# Patient Record
Sex: Female | Born: 1975 | Race: White | Hispanic: No | Marital: Married | Smoking: Never smoker
Health system: Southern US, Community
[De-identification: ages and names within clinical notes are randomized; demographics above are authoritative.]

## PROBLEM LIST (undated history)

## (undated) DIAGNOSIS — K5792 Diverticulitis of intestine, part unspecified, without perforation or abscess without bleeding: Secondary | ICD-10-CM

## (undated) DIAGNOSIS — K219 Gastro-esophageal reflux disease without esophagitis: Secondary | ICD-10-CM

## (undated) DIAGNOSIS — K5732 Diverticulitis of large intestine without perforation or abscess without bleeding: Secondary | ICD-10-CM

## (undated) DIAGNOSIS — I10 Essential (primary) hypertension: Secondary | ICD-10-CM

## (undated) DIAGNOSIS — J189 Pneumonia, unspecified organism: Secondary | ICD-10-CM

## (undated) DIAGNOSIS — K651 Peritoneal abscess: Secondary | ICD-10-CM

## (undated) DIAGNOSIS — E119 Type 2 diabetes mellitus without complications: Secondary | ICD-10-CM

## (undated) DIAGNOSIS — G4730 Sleep apnea, unspecified: Secondary | ICD-10-CM

## (undated) HISTORY — DX: Diverticulitis of large intestine without perforation or abscess without bleeding: K57.32

## (undated) HISTORY — PX: OTHER SURGICAL HISTORY: SHX169

## (undated) HISTORY — DX: Peritoneal abscess: K65.1

## (undated) HISTORY — DX: Diverticulitis of intestine, part unspecified, without perforation or abscess without bleeding: K57.92

## (undated) HISTORY — DX: Gastro-esophageal reflux disease without esophagitis: K21.9

## (undated) HISTORY — DX: Essential (primary) hypertension: I10

---

## 1999-09-12 HISTORY — PX: CHOLECYSTECTOMY: SHX55

## 2005-08-22 ENCOUNTER — Ambulatory Visit: Payer: Self-pay | Admitting: Obstetrics and Gynecology

## 2005-11-22 ENCOUNTER — Observation Stay: Payer: Self-pay

## 2005-12-04 ENCOUNTER — Inpatient Hospital Stay: Payer: Self-pay | Admitting: Obstetrics and Gynecology

## 2009-09-09 ENCOUNTER — Ambulatory Visit: Payer: Self-pay | Admitting: Obstetrics and Gynecology

## 2009-09-13 ENCOUNTER — Inpatient Hospital Stay: Payer: Self-pay | Admitting: Obstetrics and Gynecology

## 2010-05-31 ENCOUNTER — Ambulatory Visit: Payer: Self-pay | Admitting: Family Medicine

## 2010-10-11 NOTE — Assessment & Plan Note (Signed)
Summary: FLU SHOT/EVM  History of Present Illness Reason for visit: see chief complaint Chief Complaint: flu shot   Assessment New Problems: NEED PROPHYLACTIC VACCINATION&INOCULATION FLU (ICD-V04.81)   The patient and/or caregiver has been counseled thoroughly with regard to medications prescribed including dosage, schedule, interactions, rationale for use, and possible side effects and they verbalize understanding.  Diagnoses and expected course of recovery discussed and will return if not improved as expected or if the condition worsens. Patient and/or caregiver verbalized understanding.   Orders Added: 1)  Flu Vaccine 64yrs + [90658] 2)  Admin 1st Vaccine [90471]   Immunizations Administered:  Influenza Vaccine:    Vaccine Type: FLULAVAL    Site: left deltoid    Mfr: Gl[employer]oSmith[employer]    Dose: 0.5 ml    Route: IM    Given by: Levonne Spiller EMT-P    Exp. Date: 02/09/2011    Lot #: [id number]    VIS given: 04/05/10 version given May 31, 2010.  Flu Vaccine Consent Questions:    Do you have a history of severe allergic reactions to this vaccine? no    Any prior history of allergic reactions to egg and/or gelatin? no    Do you have a sensitivity to the preservative Thimersol? no    Do you have a past history of Guillan-Barre Syndrome? no    Do you currently have an acute febrile illness? no    Have you ever had a severe reaction to latex? no    Vaccine information given and explained to patient? yes    Are you currently pregnant? no

## 2013-09-19 ENCOUNTER — Ambulatory Visit (INDEPENDENT_AMBULATORY_CARE_PROVIDER_SITE_OTHER): Payer: BC Managed Care – PPO | Admitting: Podiatry

## 2013-09-19 ENCOUNTER — Ambulatory Visit (INDEPENDENT_AMBULATORY_CARE_PROVIDER_SITE_OTHER): Payer: BC Managed Care – PPO

## 2013-09-19 ENCOUNTER — Encounter: Payer: Self-pay | Admitting: Podiatry

## 2013-09-19 VITALS — BP 119/76 | HR 89 | Resp 16 | Ht 62.0 in | Wt 198.0 lb

## 2013-09-19 DIAGNOSIS — M79609 Pain in unspecified limb: Secondary | ICD-10-CM

## 2013-09-19 DIAGNOSIS — M79672 Pain in left foot: Secondary | ICD-10-CM

## 2013-09-19 DIAGNOSIS — M7750 Other enthesopathy of unspecified foot: Secondary | ICD-10-CM

## 2013-09-19 MED ORDER — DICLOFENAC SODIUM 75 MG PO TBEC: 75.0000 mg | DELAYED_RELEASE_TABLET | Freq: Two times a day (BID) | ORAL | Status: DC

## 2013-09-19 MED ORDER — TRIAMCINOLONE ACETONIDE 10 MG/ML IJ SUSP
10.0000 mg | Freq: Once | INTRAMUSCULAR | Status: AC
  Administered 2013-09-19: 10 mg

## 2013-09-19 NOTE — Progress Notes (Signed)
Subjective:     Patient ID: Maria Mcbride, female   DOB: 23-Dec-1975, 38 y.o.   MRN: [id number]  Foot Pain   patient points to the top and lateral side of the left foot stating it has been hurting her for about 8 weeks and makes it difficult for her to work her 12 hour shifts at work. 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       Objective:   Physical Exam  Nursing note and vitals reviewed. Constitutional: Maria Mcbride is oriented to person, place, and time.  Cardiovascular: Intact distal pulses.   Musculoskeletal: Normal range of motion.  Neurological: Maria Mcbride is oriented to person, place, and time.  Skin: Skin is warm.   neurovascular status intact with normal muscle strength and no equinus condition noted of both feet. Patient is found to have tendinitis of the dorsal lateral left foot with a relatively wide distribution pattern occurring below the sinus tarsi in distal to this point very painful when pressed    Assessment:     Tendinitis left lateral foot secondary to probable overusage over the holiday    Plan:     H&P and x-rays reviewed. Careful injection of the left dorsal lateral foot 3 mg Kenalog 5 mg Xylocaine Marcaine mixture to reduce inflammation and placed on voltaren  75 mg twice a day point to recheck in one

## 2013-09-19 NOTE — Progress Notes (Signed)
   Subjective:    Patient ID: Maria Mcbride, female    DOB: 1976-06-09, 38 y.o.   MRN: [id number]  HPI Comments: Pain on the top of the left foot, it started back before thanksgiving , it is a sharp pain , it aches when standing on it , worse at the end of the day , i am on my feet for 12 hours a day , taken ibuprofen and heat to it , rest   Foot Pain      Review of Systems  All other systems reviewed and are negative.       Objective:   Physical Exam        Assessment & Plan:

## 2013-09-25 ENCOUNTER — Ambulatory Visit: Payer: Self-pay | Admitting: Podiatry

## 2013-09-26 ENCOUNTER — Ambulatory Visit: Payer: BC Managed Care – PPO | Admitting: Podiatry

## 2013-10-03 ENCOUNTER — Encounter: Payer: Self-pay | Admitting: Podiatry

## 2013-10-03 ENCOUNTER — Ambulatory Visit (INDEPENDENT_AMBULATORY_CARE_PROVIDER_SITE_OTHER): Payer: BC Managed Care – PPO | Admitting: Podiatry

## 2013-10-03 VITALS — BP 124/78 | HR 104 | Resp 16 | Ht 62.0 in | Wt 195.0 lb

## 2013-10-03 DIAGNOSIS — M779 Enthesopathy, unspecified: Secondary | ICD-10-CM

## 2013-10-03 DIAGNOSIS — M7750 Other enthesopathy of unspecified foot: Secondary | ICD-10-CM

## 2013-10-03 MED ORDER — TRIAMCINOLONE ACETONIDE 10 MG/ML IJ SUSP
10.0000 mg | Freq: Once | INTRAMUSCULAR | Status: AC
  Administered 2013-10-03: 10 mg

## 2013-10-03 NOTE — Progress Notes (Signed)
Subjective:     Patient ID: Maria SchwabKristie Thurow, female   DOB: 09/27/1975, 38 y.o.   MRN: [id number]  HPI patient presents stating that the left foot is not quite sore but still quite bothersome it very difficult for me to work as bad when the pain is worse   Review of Systems     Objective:   Physical Exam Neurovascular status intact with no health history changes noted and pain that is not as much in the dorsal lateral area as it now is in the sinus tarsi left ankle    Assessment:     Tendinitis with inflammation of the left sinus tarsi with standing on feet been a complicating factor    Plan:     Sinus tarsi injection administered today and recommendation for him mobilization with air fracture walker which was dispensed a patient with instructions. Scanned for custom orthotics to reduce stress on feet and continue with old tear in and reappoint when orthotics are ready

## 2013-10-10 ENCOUNTER — Ambulatory Visit: Payer: BC Managed Care – PPO | Admitting: Podiatry

## 2013-10-21 ENCOUNTER — Encounter: Payer: Self-pay | Admitting: *Deleted

## 2013-10-21 NOTE — Progress Notes (Signed)
SENT PT POSTCARD LETTING HER KNOW ORTHOTICS ARE IN. 

## 2013-11-21 ENCOUNTER — Ambulatory Visit (INDEPENDENT_AMBULATORY_CARE_PROVIDER_SITE_OTHER): Payer: BC Managed Care – PPO | Admitting: Podiatry

## 2013-11-21 VITALS — BP 133/82 | HR 94 | Resp 16 | Ht 62.0 in | Wt 185.0 lb

## 2013-11-21 DIAGNOSIS — M779 Enthesopathy, unspecified: Secondary | ICD-10-CM

## 2013-11-21 MED ORDER — TRIAMCINOLONE ACETONIDE 10 MG/ML IJ SUSP
10.0000 mg | Freq: Once | INTRAMUSCULAR | Status: AC
  Administered 2013-11-21: 10 mg

## 2013-11-21 NOTE — Patient Instructions (Signed)

## 2013-11-21 NOTE — Progress Notes (Signed)
Subjective:     Patient ID: Maria Mcbride, female   DOB: Aug 23, 1976, 38 y.o.   MRN: [id number]  HPI patient presents to pick up her orthotics stating the ankle joint is still sore. States it's improved but she can still feel deep discomfort within the joint   Review of Systems     Objective:   Physical Exam Neurovascular status intact with no other health history changes noted with pain in the sinus tarsi left and inflammation    Assessment:     Sinus tarsitis left with inflammation and pain    Plan:     Careful injection of the sinus tarsi 3 mg Kenalog 5 of Xylocaine Marcaine mixture and dispensed orthotics with instructions on usage. Continue oral medication

## 2013-12-26 ENCOUNTER — Ambulatory Visit: Payer: BC Managed Care – PPO | Admitting: Podiatry

## 2014-01-02 ENCOUNTER — Ambulatory Visit: Payer: BC Managed Care – PPO | Admitting: Podiatry

## 2014-08-06 ENCOUNTER — Emergency Department: Payer: Self-pay | Admitting: Emergency Medicine

## 2014-12-17 LAB — HM PAP SMEAR: HM PAP: NORMAL

## 2015-10-27 ENCOUNTER — Emergency Department: Payer: BLUE CROSS/BLUE SHIELD

## 2015-10-27 ENCOUNTER — Inpatient Hospital Stay
Admission: EM | Admit: 2015-10-27 | Discharge: 2015-10-31 | DRG: 392 | Disposition: A | Discharge status: Home / Self Care | Payer: BLUE CROSS/BLUE SHIELD | Attending: Surgery | Admitting: Surgery

## 2015-10-27 ENCOUNTER — Encounter: Payer: Self-pay | Admitting: *Deleted

## 2015-10-27 DIAGNOSIS — R109 Unspecified abdominal pain: Secondary | ICD-10-CM

## 2015-10-27 DIAGNOSIS — R10A0 Flank pain, unspecified side: Secondary | ICD-10-CM

## 2015-10-27 DIAGNOSIS — Z9049 Acquired absence of other specified parts of digestive tract: Secondary | ICD-10-CM

## 2015-10-27 DIAGNOSIS — K5792 Diverticulitis of intestine, part unspecified, without perforation or abscess without bleeding: Secondary | ICD-10-CM | POA: Diagnosis present

## 2015-10-27 DIAGNOSIS — Z800 Family history of malignant neoplasm of digestive organs: Secondary | ICD-10-CM | POA: Diagnosis not present

## 2015-10-27 DIAGNOSIS — K5720 Diverticulitis of large intestine with perforation and abscess without bleeding: Secondary | ICD-10-CM | POA: Diagnosis not present

## 2015-10-27 DIAGNOSIS — E119 Type 2 diabetes mellitus without complications: Secondary | ICD-10-CM | POA: Diagnosis present

## 2015-10-27 DIAGNOSIS — R0902 Hypoxemia: Secondary | ICD-10-CM

## 2015-10-27 DIAGNOSIS — E669 Obesity, unspecified: Secondary | ICD-10-CM | POA: Diagnosis present

## 2015-10-27 DIAGNOSIS — I10 Essential (primary) hypertension: Secondary | ICD-10-CM | POA: Diagnosis present

## 2015-10-27 LAB — COMPREHENSIVE METABOLIC PANEL
ALK PHOS: 49 U/L (ref 38–126)
ALT: 25 U/L (ref 14–54)
ANION GAP: 10 (ref 5–15)
AST: 24 U/L (ref 15–41)
Albumin: 4.1 g/dL (ref 3.5–5.0)
BUN: 12 mg/dL (ref 6–20)
CALCIUM: 8.6 mg/dL — AB (ref 8.9–10.3)
CO2: 22 mmol/L (ref 22–32)
CREATININE: 0.64 mg/dL (ref 0.44–1.00)
Chloride: 104 mmol/L (ref 101–111)
Glucose, Bld: 119 mg/dL — ABNORMAL HIGH (ref 65–99)
Potassium: 3.8 mmol/L (ref 3.5–5.1)
SODIUM: 136 mmol/L (ref 135–145)
Total Bilirubin: 1.3 mg/dL — ABNORMAL HIGH (ref 0.3–1.2)
Total Protein: 7.2 g/dL (ref 6.5–8.1)

## 2015-10-27 LAB — CBC
HCT: 36.4 % (ref 35.0–47.0)
HEMOGLOBIN: 12.1 g/dL (ref 12.0–16.0)
MCH: 28.5 pg (ref 26.0–34.0)
MCHC: 33.2 g/dL (ref 32.0–36.0)
MCV: 85.7 fL (ref 80.0–100.0)
PLATELETS: 220 10*3/uL (ref 150–440)
RBC: 4.25 MIL/uL (ref 3.80–5.20)
RDW: 15.8 % — ABNORMAL HIGH (ref 11.5–14.5)
WBC: 16.4 10*3/uL — AB (ref 3.6–11.0)

## 2015-10-27 LAB — URINALYSIS COMPLETE WITH MICROSCOPIC (ARMC ONLY)
BILIRUBIN URINE: NEGATIVE
Glucose, UA: NEGATIVE mg/dL
HGB URINE DIPSTICK: NEGATIVE
KETONES UR: NEGATIVE mg/dL
LEUKOCYTES UA: NEGATIVE
NITRITE: NEGATIVE
PH: 9 — AB (ref 5.0–8.0)
PROTEIN: 30 mg/dL — AB
SPECIFIC GRAVITY, URINE: 1.027 (ref 1.005–1.030)

## 2015-10-27 LAB — PREGNANCY, URINE: Preg Test, Ur: NEGATIVE

## 2015-10-27 LAB — LIPASE, BLOOD: LIPASE: 21 U/L (ref 11–51)

## 2015-10-27 MED ORDER — PIPERACILLIN-TAZOBACTAM 3.375 G IVPB
3.3750 g | Freq: Once | INTRAVENOUS | Status: AC
  Administered 2015-10-27: 3.375 g via INTRAVENOUS

## 2015-10-27 MED ORDER — HEPARIN SODIUM (PORCINE) 5000 UNIT/ML IJ SOLN
5000.0000 [IU] | Freq: Three times a day (TID) | INTRAMUSCULAR | Status: DC
  Administered 2015-10-27 – 2015-10-31 (×11): 5000 [IU] via SUBCUTANEOUS
  Filled 2015-10-27 (×11): qty 1

## 2015-10-27 MED ORDER — MORPHINE SULFATE (PF) 4 MG/ML IV SOLN
INTRAVENOUS | Status: AC
  Administered 2015-10-27: 6 mg via INTRAVENOUS
  Filled 2015-10-27: qty 1

## 2015-10-27 MED ORDER — LACTATED RINGERS IV SOLN
INTRAVENOUS | Status: DC
  Administered 2015-10-27 – 2015-10-31 (×10): via INTRAVENOUS

## 2015-10-27 MED ORDER — HYDROCODONE-ACETAMINOPHEN 5-325 MG PO TABS
1.0000 | ORAL_TABLET | ORAL | Status: DC | PRN
  Administered 2015-10-27: 1 via ORAL
  Administered 2015-10-28 – 2015-10-29 (×3): 2 via ORAL
  Administered 2015-10-29: 1 via ORAL
  Administered 2015-10-29 – 2015-10-30 (×2): 2 via ORAL
  Filled 2015-10-27: qty 2
  Filled 2015-10-27: qty 1
  Filled 2015-10-27 (×5): qty 2

## 2015-10-27 MED ORDER — MORPHINE SULFATE (PF) 2 MG/ML IV SOLN
INTRAVENOUS | Status: AC
  Administered 2015-10-27: 2 mg via INTRAVENOUS
  Filled 2015-10-27: qty 1

## 2015-10-27 MED ORDER — PIPERACILLIN-TAZOBACTAM 3.375 G IVPB
INTRAVENOUS | Status: AC
  Administered 2015-10-27: 3.375 g via INTRAVENOUS
  Filled 2015-10-27: qty 50

## 2015-10-27 MED ORDER — ONDANSETRON HCL 4 MG/2ML IJ SOLN
4.0000 mg | Freq: Four times a day (QID) | INTRAMUSCULAR | Status: DC | PRN
  Administered 2015-10-27 – 2015-10-30 (×6): 4 mg via INTRAVENOUS
  Filled 2015-10-27 (×6): qty 2

## 2015-10-27 MED ORDER — ONDANSETRON HCL 4 MG/2ML IJ SOLN
4.0000 mg | Freq: Once | INTRAMUSCULAR | Status: AC
  Administered 2015-10-27: 4 mg via INTRAVENOUS
  Filled 2015-10-27: qty 2

## 2015-10-27 MED ORDER — MORPHINE SULFATE (PF) 2 MG/ML IV SOLN
2.0000 mg | INTRAVENOUS | Status: DC | PRN
  Administered 2015-10-27 – 2015-10-28 (×3): 2 mg via INTRAVENOUS
  Filled 2015-10-27 (×3): qty 1

## 2015-10-27 MED ORDER — DICLOFENAC SODIUM 25 MG PO TBEC
75.0000 mg | DELAYED_RELEASE_TABLET | Freq: Two times a day (BID) | ORAL | Status: DC
  Administered 2015-10-27 – 2015-10-31 (×8): 75 mg via ORAL
  Filled 2015-10-27: qty 1
  Filled 2015-10-27 (×3): qty 3
  Filled 2015-10-27 (×5): qty 1
  Filled 2015-10-27: qty 3
  Filled 2015-10-27: qty 1
  Filled 2015-10-27 (×3): qty 3
  Filled 2015-10-27: qty 1

## 2015-10-27 MED ORDER — PANTOPRAZOLE SODIUM 40 MG IV SOLR
40.0000 mg | Freq: Two times a day (BID) | INTRAVENOUS | Status: DC
  Administered 2015-10-27 – 2015-10-31 (×8): 40 mg via INTRAVENOUS
  Filled 2015-10-27 (×8): qty 40

## 2015-10-27 MED ORDER — PIPERACILLIN-TAZOBACTAM 3.375 G IVPB 30 MIN: 3.3750 g | Freq: Three times a day (TID) | INTRAVENOUS | Status: DC

## 2015-10-27 MED ORDER — METFORMIN HCL ER 500 MG PO TB24
500.0000 mg | ORAL_TABLET | Freq: Every day | ORAL | Status: DC
  Administered 2015-10-28 – 2015-10-31 (×4): 500 mg via ORAL
  Filled 2015-10-27 (×5): qty 1

## 2015-10-27 MED ORDER — PIPERACILLIN-TAZOBACTAM 3.375 G IVPB
3.3750 g | Freq: Three times a day (TID) | INTRAVENOUS | Status: DC
  Administered 2015-10-27 – 2015-10-31 (×11): 3.375 g via INTRAVENOUS
  Filled 2015-10-27 (×14): qty 50

## 2015-10-27 MED ORDER — SODIUM CHLORIDE 0.9 % IV BOLUS (SEPSIS)
1000.0000 mL | Freq: Once | INTRAVENOUS | Status: AC
  Administered 2015-10-27: 1000 mL via INTRAVENOUS

## 2015-10-27 MED ORDER — ONDANSETRON HCL 4 MG PO TABS
4.0000 mg | ORAL_TABLET | Freq: Four times a day (QID) | ORAL | Status: DC | PRN
  Administered 2015-10-29 – 2015-10-31 (×2): 4 mg via ORAL
  Filled 2015-10-27 (×3): qty 1

## 2015-10-27 MED ORDER — LISINOPRIL 10 MG PO TABS
10.0000 mg | ORAL_TABLET | Freq: Every day | ORAL | Status: DC
  Filled 2015-10-27: qty 1

## 2015-10-27 MED ORDER — MORPHINE SULFATE (PF) 4 MG/ML IV SOLN
6.0000 mg | Freq: Once | INTRAVENOUS | Status: AC
  Administered 2015-10-27: 6 mg via INTRAVENOUS

## 2015-10-27 NOTE — ED Notes (Signed)
Pt complains of lower abdominal pain starting yesterday with nausea, pt denies any other symptoms

## 2015-10-27 NOTE — H&P (Signed)
Maria Mcbride is an 40 y.o. female.    Chief Complaint: Left lower quadrant pain  HPI: This a patient with acute onset a left lower quadrant pain last night after eating and visiting with friends. She's had some fevers but had not taken her temperature. She's  never had an episode like this before. She denies nausea or vomiting had a normal bowel movement this morning melena or hematochezia  There is a family history of colon cancer  She is diabetic and has hypertension  He has had 2 C-sections and a cholecystectomy  Past Medical History  Diagnosis Date  . Hypertension     History reviewed. No pertinent past surgical history.  No family history on file. Social History:  reports that she has never smoked. She has never used smokeless tobacco. She reports that she does not drink alcohol or use illicit drugs.  Allergies: No Known Allergies   (Not in a hospital admission)   Review of Systems  Constitutional: Negative for fever, chills and weight loss.  HENT: Negative.   Eyes: Negative.   Respiratory: Negative.   Cardiovascular: Negative.   Gastrointestinal: Positive for abdominal pain. Negative for heartburn, nausea, vomiting, diarrhea, constipation, blood in stool and melena.  Genitourinary: Negative.   Musculoskeletal: Negative.   Skin: Negative.   Neurological: Negative.   Endo/Heme/Allergies: Negative.   Psychiatric/Behavioral: Negative.      Physical Exam:  BP 125/79 mmHg  Pulse 123  Temp(Src) 99.2 F (37.3 C) (Oral)  Resp 20  Ht _0  (1.575 m)  Wt 180 lb (81.647 kg)  BMI 32.91 kg/m2  SpO2 96%  LMP 10/27/2015  Physical Exam  Constitutional: She is oriented to person, place, and time and well-developed, well-nourished, and in no distress. No distress.  Not distressed but uncomfortable-appearing  HENT:  Head: Normocephalic and atraumatic.  Eyes: Pupils are equal, round, and reactive to light. Right eye exhibits no discharge. Left eye exhibits no  discharge. No scleral icterus.  Neck: Normal range of motion.  Cardiovascular: Normal rate, regular rhythm and normal heart sounds.   Pulmonary/Chest: Effort normal and breath sounds normal. No respiratory distress. She has no wheezes. She has no rales.  Abdominal: Soft. She exhibits no distension. There is tenderness. There is no rebound and no guarding.  Tender in the left lower quadrant maximal with questionable guarding but no rebound or percussion tenderness  Musculoskeletal: Normal range of motion. She exhibits no edema or tenderness.  Lymphadenopathy:    She has no cervical adenopathy.  Neurological: She is alert and oriented to person, place, and time.  Skin: Skin is warm and dry. She is not diaphoretic. No erythema.  Psychiatric: Mood and affect normal.  Vitals reviewed.       Results for orders placed or performed during the hospital encounter of 10/27/15 (from the past 48 hour(s))  Lipase, blood     Status: None   Collection Time: 10/27/15  2:19 PM  Result Value Ref Range   Lipase 21 11 - 51 U/L  Comprehensive metabolic panel     Status: Abnormal   Collection Time: 10/27/15  2:19 PM  Result Value Ref Range   Sodium 136 135 - 145 mmol/L   Potassium 3.8 3.5 - 5.1 mmol/L   Chloride 104 101 - 111 mmol/L   CO2 22 22 - 32 mmol/L   Glucose, Bld 119 (H) 65 - 99 mg/dL   BUN 12 6 - 20 mg/dL   Creatinine, Ser 0.64 0.44 - 1.00 mg/dL  Calcium 8.6 (L) 8.9 - 10.3 mg/dL   Total Protein 7.2 6.5 - 8.1 g/dL   Albumin 4.1 3.5 - 5.0 g/dL   AST 24 15 - 41 U/L   ALT 25 14 - 54 U/L   Alkaline Phosphatase 49 38 - 126 U/L   Total Bilirubin 1.3 (H) 0.3 - 1.2 mg/dL   GFR calc non Af Amer >60 >60 mL/min   GFR calc Af Amer >60 >60 mL/min    Comment: (NOTE) The eGFR has been calculated using the CKD EPI equation. This calculation has not been validated in all clinical situations. eGFR's persistently <60 mL/min signify possible Chronic Kidney Disease.    Anion gap 10 5 - 15  CBC      Status: Abnormal   Collection Time: 10/27/15  2:19 PM  Result Value Ref Range   WBC 16.4 (H) 3.6 - 11.0 K/uL   RBC 4.25 3.80 - 5.20 MIL/uL   Hemoglobin 12.1 12.0 - 16.0 g/dL   HCT 36.4 35.0 - 47.0 %   MCV 85.7 80.0 - 100.0 fL   MCH 28.5 26.0 - 34.0 pg   MCHC 33.2 32.0 - 36.0 g/dL   RDW 15.8 (H) 11.5 - 14.5 %   Platelets 220 150 - 440 K/uL  Urinalysis complete, with microscopic ([facility] only)     Status: Abnormal   Collection Time: 10/27/15  2:20 PM  Result Value Ref Range   Color, Urine YELLOW (A) YELLOW   APPearance CLEAR (A) CLEAR   Glucose, UA NEGATIVE NEGATIVE mg/dL   Bilirubin Urine NEGATIVE NEGATIVE   Ketones, ur NEGATIVE NEGATIVE mg/dL   Specific Gravity, Urine 1.027 1.005 - 1.030   Hgb urine dipstick NEGATIVE NEGATIVE   pH 9.0 (H) 5.0 - 8.0   Protein, ur 30 (A) NEGATIVE mg/dL   Nitrite NEGATIVE NEGATIVE   Leukocytes, UA NEGATIVE NEGATIVE   RBC / HPF 0-5 0 - 5 RBC/hpf   WBC, UA 0-5 0 - 5 WBC/hpf   Bacteria, UA RARE (A) NONE SEEN   Squamous Epithelial / LPF 0-5 (A) NONE SEEN   Mucous PRESENT   Pregnancy, urine     Status: None   Collection Time: 10/27/15  2:20 PM  Result Value Ref Range   Preg Test, Ur NEGATIVE NEGATIVE   Ct Renal Stone Study  10/27/2015  CLINICAL DATA:  Lower abdominal pain beginning yesterday with nausea. EXAM: CT ABDOMEN AND PELVIS WITHOUT CONTRAST TECHNIQUE: Multidetector CT imaging of the abdomen and pelvis was performed following the standard protocol without IV contrast. COMPARISON:  None. FINDINGS: Lung bases are normal. Abdominal images demonstrate mild amount of free peritoneal air scattered over the anterior upper abdomen. There is diverticulosis of the colon. There is mild inflammatory change adjacent a short segment of sigmoid colon with several extraluminal foci of air just inferior lateral to this inflamed segment likely the site of perforation secondary to acute diverticulitis. No evidence of diverticular abscess. No evidence of mass or  adjacent mesenteric adenopathy. Minimal fluid over the left pericolic gutter. Appendix is normal. Remainder of the colon small bowel are within normal. There is evidence of a previous cholecystectomy. The liver, spleen, pancreas and adrenal glands are normal. Stomach is within normal. Kidneys are normal in size, shape and position without hydronephrosis or nephrolithiasis. Ureters are normal. Vascular structures are within normal. Pelvic images demonstrate an IUD in adequate position. The uterus, ovaries, rectum and bladder are within normal. Remaining bones and soft tissues are within normal. IMPRESSION: Findings compatible with acute  diverticulitis of a short segment of the sigmoid colon with evidence of perforation with mild free peritoneal air. No evidence of diverticular abscess. No evidence of mass or adjacent adenopathy. Critical Value/emergent results were called by telephone at the time of interpretation on 10/27/2015 at 5:04 pm to Dr. Charlotte Crumb , who verbally acknowledged these results. Electronically Signed   By: Marin Olp M.D.   On: 10/27/2015 17:04     Assessment/Plan  CT scan and labs personally reviewed. This suggestion is that of microperforation was some minor free air suggesting perforated diverticulitis. I've discussed with she and her husband the rationale for admission to the hospital on IV antibiotics she has been given a dose of Zosyn. All questions were answered for them they understood and agreed to proceed with this plan  Florene Glen, MD, FACS

## 2015-10-27 NOTE — ED Provider Notes (Signed)
[facility] Emergency Department Provider Note  ____________________________________________   I have reviewed the triage vital signs and the nursing notes.   HISTORY  Chief Complaint Abdominal Pain    HPI Maria Mcbride is a 40 y.o. female  Who presents today complaining of abdominal pain. She states that she has not had a fever but she has felt somewhat warm. The pain began gradually last night. Left lower quadrant. She denies any fever or vomiting. She states the pain has been gradually worsening. She has had nausea. She denies any melena or bright red blood per rectum. She denies any vaginal discharge or vaginal bleeding. She states that she has had a left oophorectomy years ago.Nothing makes the pain better nothing makes it worse.  Past Medical History  Diagnosis Date  . Hypertension     There are no active problems to display for this patient.   History reviewed. No pertinent past surgical history.  Current Outpatient Rx  Name  Route  Sig  Dispense  Refill  . diclofenac (VOLTAREN) 75 MG EC tablet   Oral   Take 1 tablet (75 mg total) by mouth 2 (two) times daily.   50 tablet   1   . lisinopril (PRINIVIL,ZESTRIL) 10 MG tablet               . metFORMIN (GLUCOPHAGE-XR) 500 MG 24 hr tablet                 Allergies Review of patient's allergies indicates no known allergies.  No family history on file.  Social History Social History  Substance Use Topics  . Smoking status: Never Smoker   . Smokeless tobacco: Never Used  . Alcohol Use: No    Review of Systems Constitutional: No fever/chills Eyes: No visual changes. ENT: No sore throat. No stiff neck no neck pain Cardiovascular: Denies chest pain. Respiratory: Denies shortness of breath. Gastrointestinal:   no vomiting.  No diarrhea.  No constipation. Genitourinary: Negative for dysuria. Musculoskeletal: Negative lower extremity  swelling Skin: Negative for rash. Neurological: Negative for headaches, focal weakness or numbness. 10-point ROS otherwise negative.  ____________________________________________   PHYSICAL EXAM:  VITAL SIGNS: ED Triage Vitals  Enc Vitals Group     BP 10/27/15 1408 125/79 mmHg     Pulse Rate 10/27/15 1408 123     Resp 10/27/15 1408 20     Temp 10/27/15 1408 99.2 F (37.3 C)     Temp Source 10/27/15 1408 Oral     SpO2 10/27/15 1408 96 %     Weight 10/27/15 1408 180 lb (81.647 kg)     Height 10/27/15 1408  (1.575 m)     Head Cir --      Peak Flow --      Pain Score 10/27/15 1409 9     Pain Loc --      Pain Edu? --      Excl. in GC? --     Constitutional: Alert and oriented. Appears as if she does not feel well but nontoxic Eyes: Conjunctivae are normal. PERRL. EOMI. Head: Atraumatic. Nose: No congestion/rhinnorhea. Mouth/Throat: Mucous membranes are moist.  Oropharynx non-erythematous. Neck: No stridor.   Nontender with no meningismus Cardiovascular: Normal rate, regular rhythm. Grossly normal heart sounds.  Good peripheral circulation. Respiratory: Normal respiratory effort.  No retractions. Lungs CTAB. Abdominal: Positive  left lower quadrant tenderness, guarding but no rebound. Nonsurgical abdomen at this point No distention. Back:  There is no focal tenderness or step off there is no midline tenderness there are no lesions noted. there is left CVA tenderness Musculoskeletal: No lower extremity tenderness. No joint effusions, no DVT signs strong distal pulses no edema Neurologic:  Normal speech and language. No gross focal neurologic deficits are appreciated.  Skin:  Skin is warm, dry and intact. No rash noted. Psychiatric: Mood and affect are normal. Speech and behavior are normal.  ____________________________________________   LABS (all labs ordered are listed, but only abnormal results are displayed)  Labs Reviewed  COMPREHENSIVE METABOLIC PANEL -  Abnormal; Notable for the following:    Glucose, Bld 119 (*)    Calcium 8.6 (*)    Total Bilirubin 1.3 (*)    All other components within normal limits  CBC - Abnormal; Notable for the following:    WBC 16.4 (*)    RDW 15.8 (*)    All other components within normal limits  URINALYSIS COMPLETEWITH MICROSCOPIC ([facility] ONLY) - Abnormal; Notable for the following:    Color, Urine YELLOW (*)    APPearance CLEAR (*)    pH 9.0 (*)    Protein, ur 30 (*)    Bacteria, UA RARE (*)    Squamous Epithelial / LPF 0-5 (*)    All other components within normal limits  LIPASE, BLOOD  PREGNANCY, URINE  POC URINE PREG, ED   ____________________________________________  EKG  I personally interpreted any EKGs ordered by me or triage  ____________________________________________  RADIOLOGY  I reviewed any imaging ordered by me or triage that were performed during my shift ____________________________________________   PROCEDURES  Procedure(s) performed: None  Critical Care performed: None  ____________________________________________   INITIAL IMPRESSION / ASSESSMENT AND PLAN / ED COURSE  Pertinent labs & imaging results that were available during my care of the patient were reviewed by me and considered in my medical decision making (see chart for details).  Patient with perforated diverticulitis, Dr. Excell Seltzer of surgery is kind enough to come see the patient we will give her IV antibiotics she will require admission. ____________________________________________   FINAL CLINICAL IMPRESSION(S) / ED DIAGNOSES  Perforated diverticulitis    This chart was dictated using voice recognition software.  Despite best efforts to proofread,  errors can occur which can change meaning.     Jeanmarie Plant, MD 10/27/15 [phone]

## 2015-10-28 LAB — CBC
HEMATOCRIT: 32.2 % — AB (ref 35.0–47.0)
HEMOGLOBIN: 10.9 g/dL — AB (ref 12.0–16.0)
MCH: 29.9 pg (ref 26.0–34.0)
MCHC: 33.7 g/dL (ref 32.0–36.0)
MCV: 88.6 fL (ref 80.0–100.0)
Platelets: 173 10*3/uL (ref 150–440)
RBC: 3.64 MIL/uL — ABNORMAL LOW (ref 3.80–5.20)
RDW: 16.2 % — ABNORMAL HIGH (ref 11.5–14.5)
WBC: 14.4 10*3/uL — AB (ref 3.6–11.0)

## 2015-10-28 LAB — BASIC METABOLIC PANEL
ANION GAP: 7 (ref 5–15)
BUN: 9 mg/dL (ref 6–20)
CALCIUM: 7.9 mg/dL — AB (ref 8.9–10.3)
CO2: 26 mmol/L (ref 22–32)
Chloride: 103 mmol/L (ref 101–111)
Creatinine, Ser: 0.67 mg/dL (ref 0.44–1.00)
GLUCOSE: 115 mg/dL — AB (ref 65–99)
POTASSIUM: 3.1 mmol/L — AB (ref 3.5–5.1)
SODIUM: 136 mmol/L (ref 135–145)

## 2015-10-28 MED ORDER — DIPHENHYDRAMINE HCL 25 MG PO CAPS: 25.0000 mg | ORAL_CAPSULE | Freq: Four times a day (QID) | ORAL | Status: DC | PRN

## 2015-10-28 MED ORDER — IRBESARTAN 75 MG PO TABS
150.0000 mg | ORAL_TABLET | Freq: Every day | ORAL | Status: DC
  Filled 2015-10-28 (×2): qty 2

## 2015-10-28 MED ORDER — MORPHINE SULFATE (PF) 2 MG/ML IV SOLN
2.0000 mg | INTRAVENOUS | Status: DC | PRN
  Filled 2015-10-28: qty 1

## 2015-10-28 MED ORDER — MORPHINE SULFATE (PF) 2 MG/ML IV SOLN
2.0000 mg | INTRAVENOUS | Status: DC | PRN
  Administered 2015-10-28: 2 mg via INTRAVENOUS
  Administered 2015-10-28 – 2015-10-31 (×5): 4 mg via INTRAVENOUS
  Filled 2015-10-28: qty 2
  Filled 2015-10-28: qty 1
  Filled 2015-10-28 (×4): qty 2
  Filled 2015-10-28: qty 1

## 2015-10-28 NOTE — Progress Notes (Signed)
CC: Left lower quadrant abdominal pain Subjective: Is a patient admitted yesterday from the emergency room with a diagnosis of diverticulitis with microperforation. She states she's feeling much better today after only 18 hours of antibiotics. She's had no nausea or vomiting and is tolerating a clear liquid diet denies fevers or chills.  Objective: Vital signs in last 24 hours: Temp:  [98.7 F (37.1 C)-99.2 F (37.3 C)] 98.7 F (37.1 C) (02/15 2041) Pulse Rate:  [72-123] 107 (02/15 2041) Resp:  [14-20] 17 (02/15 2041) BP: (99-125)/(62-79) 110/78 mmHg (02/15 2041) SpO2:  [96 %-99 %] 98 % (02/15 2041) Weight:  [180 lb (81.647 kg)-204 lb 8 oz (92.761 kg)] 204 lb 8 oz (92.761 kg) (02/15 2041) Last BM Date: 10/27/15  Intake/Output from previous day: 02/15 0701 - 02/16 0700 In: 901 [I.V.:901] Out: -  Intake/Output this shift: Total I/O In: 830 [P.O.:240; I.V.:590] Out: -   Physical exam: Abdomen is soft and minimally tender in the left lower quadrant with an improved exam overall no peritoneal signs.  Calves are nontender  Lab Results: CBC   Recent Labs  10/27/15 1419 10/28/15 0507  WBC 16.4* 14.4*  HGB 12.1 10.9*  HCT 36.4 32.2*  PLT 220 173   BMET  Recent Labs  10/27/15 1419 10/28/15 0507  NA 136 136  K 3.8 3.1*  CL 104 103  CO2 22 26  GLUCOSE 119* 115*  BUN 12 9  CREATININE 0.64 0.67  CALCIUM 8.6* 7.9*   PT/INR No results for input(s): LABPROT, INR in the last 72 hours. ABG No results for input(s): PHART, HCO3 in the last 72 hours.  Invalid input(s): PCO2, PO2  Studies/Results: Ct Renal Stone Study  10/27/2015  CLINICAL DATA:  Lower abdominal pain beginning yesterday with nausea. EXAM: CT ABDOMEN AND PELVIS WITHOUT CONTRAST TECHNIQUE: Multidetector CT imaging of the abdomen and pelvis was performed following the standard protocol without IV contrast. COMPARISON:  None. FINDINGS: Lung bases are normal. Abdominal images demonstrate mild amount of free  peritoneal air scattered over the anterior upper abdomen. There is diverticulosis of the colon. There is mild inflammatory change adjacent a short segment of sigmoid colon with several extraluminal foci of air just inferior lateral to this inflamed segment likely the site of perforation secondary to acute diverticulitis. No evidence of diverticular abscess. No evidence of mass or adjacent mesenteric adenopathy. Minimal fluid over the left pericolic gutter. Appendix is normal. Remainder of the colon small bowel are within normal. There is evidence of a previous cholecystectomy. The liver, spleen, pancreas and adrenal glands are normal. Stomach is within normal. Kidneys are normal in size, shape and position without hydronephrosis or nephrolithiasis. Ureters are normal. Vascular structures are within normal. Pelvic images demonstrate an IUD in adequate position. The uterus, ovaries, rectum and bladder are within normal. Remaining bones and soft tissues are within normal. IMPRESSION: Findings compatible with acute diverticulitis of a short segment of the sigmoid colon with evidence of perforation with mild free peritoneal air. No evidence of diverticular abscess. No evidence of mass or adjacent adenopathy. Critical Value/emergent results were called by telephone at the time of interpretation on 10/27/2015 at 5:04 pm to Dr. Ileana Roup , who verbally acknowledged these results. Electronically Signed   By: Elberta Fortis M.D.   On: 10/27/2015 17:04    Anti-infectives: Anti-infectives    Start     Dose/Rate Route Frequency Ordered Stop   10/27/15 2200  piperacillin-tazobactam (ZOSYN) IVPB 3.375 g  Status:  Discontinued  3.375 g 100 mL/hr over 30 Minutes Intravenous 3 times per day 10/27/15 1802 10/27/15 2027   10/27/15 2200  piperacillin-tazobactam (ZOSYN) IVPB 3.375 g     3.375 g 12.5 mL/hr over 240 Minutes Intravenous 3 times per day 10/27/15 2027     10/27/15 1715  piperacillin-tazobactam (ZOSYN) IVPB  3.375 g     3.375 g 12.5 mL/hr over 240 Minutes Intravenous  Once 10/27/15 1705 10/27/15 1915      Assessment/Plan:  She's showing considerable improvement will continue IV antibiotics for today and consider discharge tomorrow if she continues to improve  Lattie Haw, MD, Carilyn Goodpasture  10/28/2015

## 2015-10-29 LAB — CBC WITH DIFFERENTIAL/PLATELET
BASOS ABS: 0 10*3/uL (ref 0–0.1)
Basophils Relative: 0 %
EOS ABS: 0 10*3/uL (ref 0–0.7)
EOS PCT: 0 %
HCT: 30.3 % — ABNORMAL LOW (ref 35.0–47.0)
HEMOGLOBIN: 10.1 g/dL — AB (ref 12.0–16.0)
LYMPHS PCT: 12 %
Lymphs Abs: 1.6 10*3/uL (ref 1.0–3.6)
MCH: 29.2 pg (ref 26.0–34.0)
MCHC: 33.3 g/dL (ref 32.0–36.0)
MCV: 87.7 fL (ref 80.0–100.0)
Monocytes Absolute: 0.6 10*3/uL (ref 0.2–0.9)
Monocytes Relative: 5 %
NEUTROS PCT: 83 %
Neutro Abs: 10.8 10*3/uL — ABNORMAL HIGH (ref 1.4–6.5)
PLATELETS: 163 10*3/uL (ref 150–440)
RBC: 3.45 MIL/uL — AB (ref 3.80–5.20)
RDW: 15.8 % — ABNORMAL HIGH (ref 11.5–14.5)
WBC: 13.1 10*3/uL — AB (ref 3.6–11.0)

## 2015-10-29 MED ORDER — PROMETHAZINE HCL 25 MG/ML IJ SOLN
12.5000 mg | Freq: Four times a day (QID) | INTRAMUSCULAR | Status: DC | PRN
  Administered 2015-10-29 – 2015-10-30 (×2): 12.5 mg via INTRAVENOUS
  Filled 2015-10-29 (×2): qty 1

## 2015-10-29 NOTE — Progress Notes (Signed)
CC: Q diverticulitis Subjective: This patient with acute diverticulitis and left lower quadrant pain and tenderness she's been admitted and started on IV antibiotics. She is improving albeit slowly her pain is a little bit worse than it was yesterday but overall much improved over previous statements. She's had no nausea or vomiting and is tolerating clear liquids she had a small bowel movement is having some pain when she urinates but the pain is in her rectum not in the urethra.  Objective: Vital signs in last 24 hours: Temp:  [97.4 F (36.3 C)-102.1 F (38.9 C)] 97.4 F (36.3 C) (02/17 0624) Pulse Rate:  [87-125] 87 (02/17 0624) Resp:  [16-18] 16 (02/17 0624) BP: (104-118)/(60-68) 104/63 mmHg (02/17 0624) SpO2:  [93 %-97 %] 93 % (02/17 0624) Last BM Date: 10/27/15  Intake/Output from previous day: 02/16 0701 - 02/17 0700 In: 4166.9 [P.O.:840; I.V.:3326.9] Out: 2500 [Urine:2500] Intake/Output this shift: Total I/O In: 262.1 [I.V.:247; IV Piggyback:15.1] Out: 0   Physical exam:  Obese I'll signs are reviewed Abdomen is soft tender in the left lower quadrant without peritoneal signs nontender calves no jaundice no icterus  Lab Results: CBC   Recent Labs  10/28/15 0507 10/29/15 0427  WBC 14.4* 13.1*  HGB 10.9* 10.1*  HCT 32.2* 30.3*  PLT 173 163   BMET  Recent Labs  10/27/15 1419 10/28/15 0507  NA 136 136  K 3.8 3.1*  CL 104 103  CO2 22 26  GLUCOSE 119* 115*  BUN 12 9  CREATININE 0.64 0.67  CALCIUM 8.6* 7.9*   PT/INR No results for input(s): LABPROT, INR in the last 72 hours. ABG No results for input(s): PHART, HCO3 in the last 72 hours.  Invalid input(s): PCO2, PO2  Studies/Results: Ct Renal Stone Study  10/27/2015  CLINICAL DATA:  Lower abdominal pain beginning yesterday with nausea. EXAM: CT ABDOMEN AND PELVIS WITHOUT CONTRAST TECHNIQUE: Multidetector CT imaging of the abdomen and pelvis was performed following the standard protocol without IV  contrast. COMPARISON:  None. FINDINGS: Lung bases are normal. Abdominal images demonstrate mild amount of free peritoneal air scattered over the anterior upper abdomen. There is diverticulosis of the colon. There is mild inflammatory change adjacent a short segment of sigmoid colon with several extraluminal foci of air just inferior lateral to this inflamed segment likely the site of perforation secondary to acute diverticulitis. No evidence of diverticular abscess. No evidence of mass or adjacent mesenteric adenopathy. Minimal fluid over the left pericolic gutter. Appendix is normal. Remainder of the colon small bowel are within normal. There is evidence of a previous cholecystectomy. The liver, spleen, pancreas and adrenal glands are normal. Stomach is within normal. Kidneys are normal in size, shape and position without hydronephrosis or nephrolithiasis. Ureters are normal. Vascular structures are within normal. Pelvic images demonstrate an IUD in adequate position. The uterus, ovaries, rectum and bladder are within normal. Remaining bones and soft tissues are within normal. IMPRESSION: Findings compatible with acute diverticulitis of a short segment of the sigmoid colon with evidence of perforation with mild free peritoneal air. No evidence of diverticular abscess. No evidence of mass or adjacent adenopathy. Critical Value/emergent results were called by telephone at the time of interpretation on 10/27/2015 at 5:04 pm to Dr. Ileana Roup , who verbally acknowledged these results. Electronically Signed   By: Elberta Fortis M.D.   On: 10/27/2015 17:04    Anti-infectives: Anti-infectives    Start     Dose/Rate Route Frequency Ordered Stop   10/27/15 2200  piperacillin-tazobactam (ZOSYN) IVPB 3.375 g  Status:  Discontinued     3.375 g 100 mL/hr over 30 Minutes Intravenous 3 times per day 10/27/15 1802 10/27/15 2027   10/27/15 2200  piperacillin-tazobactam (ZOSYN) IVPB 3.375 g     3.375 g 12.5 mL/hr over  240 Minutes Intravenous 3 times per day 10/27/15 2027     10/27/15 1715  piperacillin-tazobactam (ZOSYN) IVPB 3.375 g     3.375 g 12.5 mL/hr over 240 Minutes Intravenous  Once 10/27/15 1705 10/27/15 1915      Assessment/Plan:  White blood still remains elevated but not as high as on admission. Recommend continuing IV antibiotics and clear liquids for now  Lattie Haw, MD, FACS  10/29/2015

## 2015-10-29 NOTE — Care Management (Signed)
Tolerating clear liquids.Diet being advanced

## 2015-10-29 NOTE — Progress Notes (Signed)
Called Dr. Everlene Farrier regarding nausea medication per patient request.  Orders placed.  Arturo Morton  10/29/2015  7:44 PM

## 2015-10-30 ENCOUNTER — Inpatient Hospital Stay: Payer: BLUE CROSS/BLUE SHIELD

## 2015-10-30 DIAGNOSIS — R0902 Hypoxemia: Secondary | ICD-10-CM | POA: Insufficient documentation

## 2015-10-30 LAB — CBC WITH DIFFERENTIAL/PLATELET
BASOS PCT: 0 %
Basophils Absolute: 0 10*3/uL (ref 0–0.1)
EOS ABS: 0.1 10*3/uL (ref 0–0.7)
EOS PCT: 1 %
HCT: 29.3 % — ABNORMAL LOW (ref 35.0–47.0)
Hemoglobin: 9.6 g/dL — ABNORMAL LOW (ref 12.0–16.0)
Lymphocytes Relative: 8 %
Lymphs Abs: 1.1 10*3/uL (ref 1.0–3.6)
MCH: 29 pg (ref 26.0–34.0)
MCHC: 32.6 g/dL (ref 32.0–36.0)
MCV: 89.1 fL (ref 80.0–100.0)
MONO ABS: 0.5 10*3/uL (ref 0.2–0.9)
MONOS PCT: 4 %
Neutro Abs: 11.6 10*3/uL — ABNORMAL HIGH (ref 1.4–6.5)
Neutrophils Relative %: 87 %
Platelets: 166 10*3/uL (ref 150–440)
RBC: 3.29 MIL/uL — ABNORMAL LOW (ref 3.80–5.20)
RDW: 16.2 % — AB (ref 11.5–14.5)
WBC: 13.3 10*3/uL — ABNORMAL HIGH (ref 3.6–11.0)

## 2015-10-30 NOTE — Progress Notes (Signed)
CC: Acute diverticulitis Subjective: 's patient on IV antibiotics for acute diverticulitis she feels better today she has been placed on oxygen for a low oxygen saturation level when she was sleeping last night suggestive of sleep apnea. Patient agrees that she probably has sleep apnea as she has dropped problems sleeping at home and is obese. She has never been worked up for it. She is tolerating clears and overall feels better she is not short of breath and does not have any chest pain  Objective: Vital signs in last 24 hours: Temp:  [97.6 F (36.4 C)-97.8 F (36.6 C)] 97.6 F (36.4 C) (02/18 0446) Pulse Rate:  [102-122] 112 (02/18 0446) Resp:  [18-20] 20 (02/18 0446) BP: (103-108)/(58-72) 103/58 mmHg (02/18 0446) SpO2:  [78 %-97 %] 78 % (02/18 0446) Last BM Date: 10/27/15  Intake/Output from previous day: 02/17 0701 - 02/18 0700 In: 2170.4 [P.O.:840; I.V.:1243.1; IV Piggyback:87.3] Out: 900 [Urine:550; Stool:350] Intake/Output this shift:    Physical exam:  Awake alert oriented Chest clear no rales or rhonchi Abdomen is soft less tender no peritoneal signs maximal tenderness is in the left lower quadrant but much improved Abs are nontender  Lab Results: CBC   Recent Labs  10/29/15 0427 10/30/15 0520  WBC 13.1* 13.3*  HGB 10.1* 9.6*  HCT 30.3* 29.3*  PLT 163 166   BMET  Recent Labs  10/27/15 1419 10/28/15 0507  NA 136 136  K 3.8 3.1*  CL 104 103  CO2 22 26  GLUCOSE 119* 115*  BUN 12 9  CREATININE 0.64 0.67  CALCIUM 8.6* 7.9*   PT/INR No results for input(s): LABPROT, INR in the last 72 hours. ABG No results for input(s): PHART, HCO3 in the last 72 hours.  Invalid input(s): PCO2, PO2  Studies/Results: No results found.  Anti-infectives: Anti-infectives    Start     Dose/Rate Route Frequency Ordered Stop   10/27/15 2200  piperacillin-tazobactam (ZOSYN) IVPB 3.375 g  Status:  Discontinued     3.375 g 100 mL/hr over 30 Minutes Intravenous 3 times  per day 10/27/15 1802 10/27/15 2027   10/27/15 2200  piperacillin-tazobactam (ZOSYN) IVPB 3.375 g     3.375 g 12.5 mL/hr over 240 Minutes Intravenous 3 times per day 10/27/15 2027     10/27/15 1715  piperacillin-tazobactam (ZOSYN) IVPB 3.375 g     3.375 g 12.5 mL/hr over 240 Minutes Intravenous  Once 10/27/15 1705 10/27/15 1915      Assessment/Plan:  White blood cell count remains slightly elevated so we will continue IV antibiotics for now and will recheck saturation level of oxygen when awake and up.  Lattie Haw, MD, FACS  10/30/2015

## 2015-10-31 LAB — CBC WITH DIFFERENTIAL/PLATELET
Basophils Absolute: 0 10*3/uL (ref 0–0.1)
Basophils Relative: 0 %
Eosinophils Absolute: 0.1 10*3/uL (ref 0–0.7)
Eosinophils Relative: 1 %
HCT: 27.8 % — ABNORMAL LOW (ref 35.0–47.0)
HEMOGLOBIN: 9.3 g/dL — AB (ref 12.0–16.0)
LYMPHS PCT: 11 %
Lymphs Abs: 1.1 10*3/uL (ref 1.0–3.6)
MCH: 29.7 pg (ref 26.0–34.0)
MCHC: 33.5 g/dL (ref 32.0–36.0)
MCV: 88.5 fL (ref 80.0–100.0)
Monocytes Absolute: 0.4 10*3/uL (ref 0.2–0.9)
Monocytes Relative: 4 %
NEUTROS ABS: 8.6 10*3/uL — AB (ref 1.4–6.5)
Neutrophils Relative %: 84 %
PLATELETS: 196 10*3/uL (ref 150–440)
RBC: 3.15 MIL/uL — ABNORMAL LOW (ref 3.80–5.20)
RDW: 16.1 % — AB (ref 11.5–14.5)
WBC: 10.3 10*3/uL (ref 3.6–11.0)

## 2015-10-31 MED ORDER — AMOXICILLIN-POT CLAVULANATE 875-125 MG PO TABS
1.0000 | ORAL_TABLET | Freq: Two times a day (BID) | ORAL | Status: DC
Start: 2015-10-31 — End: 2015-11-11

## 2015-10-31 MED ORDER — ONDANSETRON 4 MG PO TBDP: 4.0000 mg | ORAL_TABLET | Freq: Three times a day (TID) | ORAL | Status: DC | PRN

## 2015-10-31 MED ORDER — HYDROCODONE-ACETAMINOPHEN 5-325 MG PO TABS: 1.0000 | ORAL_TABLET | ORAL | Status: DC | PRN

## 2015-10-31 NOTE — Progress Notes (Signed)
Patient feels well and wants to be discharged afternoon she is tolerating her diet we'll discharge this afternoon and follow-up in 2 weeks

## 2015-10-31 NOTE — Discharge Instructions (Signed)
Resume normal activities Maintain a full liquid diet for a few days and then switch to a soft diet Assume all home medications Start oral antibiotics as prescribed and take oral analgesics as needed Follow-up with Dr. Excell Seltzer in 2 weeks

## 2015-10-31 NOTE — Progress Notes (Signed)
Throughout the night pt has had severe upper abdominal pain under the right side of her rib cage. RN has expressed to her that moving around and walking more will help move the gas pain .She seems very concerned about the type of pain she is having. However, the PRN morphine dose is effective for her pain. Will continue to monitor and encourage ambulation.   Karsten Ro

## 2015-10-31 NOTE — Progress Notes (Signed)
CC: Acute diverticulitis Subjective: Is patient with acute diverticulitis with microperforation. She's had ongoing left lower quadrant pain which is improved. The patient made no mention to me of right sided upper abdominal pain that is noted in the RN note by Ms. Melynda Ripple was personally reviewed. She only stated that she was feeling better and that her left lower quadrant pain was improved she has no nausea vomiting no fevers or chills and wants to advance her diet at some point  Objective: Vital signs in last 24 hours: Temp:  [97.5 F (36.4 C)-99.7 F (37.6 C)] 98.3 F (36.8 C) (02/19 0521) Pulse Rate:  [102-121] 102 (02/19 0521) Resp:  [20] 20 (02/19 0521) BP: (104-131)/(61-80) 128/80 mmHg (02/19 0521) SpO2:  [98 %-99 %] 98 % (02/19 0521) Last BM Date: 10/30/15  Intake/Output from previous day: 02/18 0701 - 02/19 0700 In: 304 [IV Piggyback:304] Out: -  Intake/Output this shift: Total I/O In: 206 [I.V.:183; IV Piggyback:23] Out: -   Physical exam:  Obese comfortable-appearing vital signs personally reviewed abdomen is soft and much less tender in the left lower quadrant no tenderness throughout the rest of the abdomen calves are nontender no icterus no jaundice  Lab Results: CBC   Recent Labs  10/30/15 0520 10/31/15 0458  WBC 13.3* 10.3  HGB 9.6* 9.3*  HCT 29.3* 27.8*  PLT 166 196   BMET No results for input(s): NA, K, CL, CO2, GLUCOSE, BUN, CREATININE, CALCIUM in the last 72 hours. PT/INR No results for input(s): LABPROT, INR in the last 72 hours. ABG No results for input(s): PHART, HCO3 in the last 72 hours.  Invalid input(s): PCO2, PO2  Studies/Results: Dg Chest Port 1 View  10/30/2015  CLINICAL DATA:  Hypoxemia.  Acute diverticulitis. EXAM: PORTABLE CHEST 1 VIEW COMPARISON:  None. FINDINGS: Normal sized heart. Clear left lung. Right basilar linear and patchy opacity. Unremarkable bones. IMPRESSION: Mild-to-moderate right basal atelectasis. Underlying pneumonia  is unlikely. Electronically Signed   By: Beckie Salts M.D.   On: 10/30/2015 10:14    Anti-infectives: Anti-infectives    Start     Dose/Rate Route Frequency Ordered Stop   10/27/15 2200  piperacillin-tazobactam (ZOSYN) IVPB 3.375 g  Status:  Discontinued     3.375 g 100 mL/hr over 30 Minutes Intravenous 3 times per day 10/27/15 1802 10/27/15 2027   10/27/15 2200  piperacillin-tazobactam (ZOSYN) IVPB 3.375 g     3.375 g 12.5 mL/hr over 240 Minutes Intravenous 3 times per day 10/27/15 2027     10/27/15 1715  piperacillin-tazobactam (ZOSYN) IVPB 3.375 g     3.375 g 12.5 mL/hr over 240 Minutes Intravenous  Once 10/27/15 1705 10/27/15 1915      Assessment/Plan:  Acute diverticulitis with microperforation patient is subjectively improving and objectively improving as well her white blood cell count has normalized. I will recommend starting full liquids today and reexamine later today she may be able to go home on oral antibiotics this afternoon or tomorrow.  Lattie Haw, MD, FACS  10/31/2015

## 2015-10-31 NOTE — Discharge Summary (Signed)
Physician Discharge Summary  Patient ID: DAFNEY FARLERobb MRN: [id number] DOB/AGE: 1976-08-08 39 y.o.  Admit date: 10/27/2015 Discharge date: 10/31/2015   Discharge Diagnoses:  Active Problems:   Diverticulitis   Hypoxemia   Procedures:none  Hospital Course: This patient with acute diverticulitis with microperforation. She was admitted the hospital and started on IV antibiotics and clear liquids during the course of the hospitalization her white blood cell count came back towards normal and she is tolerating a full liquid diet at this point with instructions to stay on full liquids and then change soft diet in the near future. She is transition to oral antibiotics and will follow up in our office in 10 days.  Consults:none  Disposition:      Medication List    TAKE these medications        amoxicillin-clavulanate 875-125 MG tablet  Commonly known as:  AUGMENTIN  Take 1 tablet by mouth 2 (two) times daily.     HYDROcodone-acetaminophen 5-325 MG tablet  Commonly known as:  NORCO/VICODIN  Take 1-2 tablets by mouth every 4 (four) hours as needed for moderate pain.     metFORMIN 500 MG 24 hr tablet  Commonly known as:  GLUCOPHAGE-XR  Take 500 mg by mouth 2 (two) times daily.     olmesartan 20 MG tablet  Commonly known as:  BENICAR  Take 20 mg by mouth daily.     ondansetron 4 MG disintegrating tablet  Commonly known as:  ZOFRAN ODT  Take 1 tablet (4 mg total) by mouth every 8 (eight) hours as needed for nausea or vomiting.           Follow-up Information    Follow up with Dionne Milo, MD In 2 weeks.   Specialty:  Surgery   Why:  follow up   Contact information:   [address] [place] [address] [phone]       Lattie Haw, MD, FACS

## 2015-11-01 ENCOUNTER — Telehealth: Payer: Self-pay | Admitting: Surgery

## 2015-11-01 NOTE — Telephone Encounter (Signed)
Returned phone call to patient at this time. She has questions regarding what diverticulitis is and what type of diet she needs to be on. We spoke in great length about this and she was also sent information through Mychart to support the discussion. She has a follow-up appointment for 3/6 with Dr. 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 She will send her disability paperwork to [place] via fax to fill out. She does not feel comfortable going back to work until seen by Careers adviser. Did explain that surgery would not be right away as we have to wait until this episode of Diverticulitis is behind [place] to do this surgery, electively.  Encouraged to call back with any questions or worsening symptoms prior to her 3/6 appointment. She verbalizes understanding of conversation.

## 2015-11-01 NOTE — Telephone Encounter (Signed)
Patient was seen in ED this weekend and I made her an appointment with Dr. Excell Seltzer. She would like someone to call her back regarding diverticulitis. The nurse there didn't give her a lot of information.

## 2015-11-03 ENCOUNTER — Telehealth: Payer: Self-pay | Admitting: Surgery

## 2015-11-03 LAB — HM DIABETES EYE EXAM

## 2015-11-03 NOTE — Telephone Encounter (Signed)
Patient called and said she is having blood in urine and some pain. Taking medication that she was sent home with. No fever

## 2015-11-03 NOTE — Telephone Encounter (Signed)
Pelvic pain has been consistent since leaving hospital but is having Hematuria or Vaginal Bleeding which is new today. Denies nausea/vomiting or fever. But patient is acting unsure that this may be vaginal or from urethra throughout call.  Reviewed CT scan and last UA with Dr. Orvis Brill. No new orders from provider.  Explained to patient that nothing was seen on CT scan that should be causing this. Bowels are not likely the cause of her bleeding.  Patient states that she has a Mirena in place and does not typically have a period because of this.  Encouraged patient to call GYN to ensure that this is not related to Mirena and to obtain a urine if this is coming from urethra.

## 2015-11-04 ENCOUNTER — Emergency Department: Payer: BLUE CROSS/BLUE SHIELD

## 2015-11-04 ENCOUNTER — Telehealth: Payer: Self-pay | Admitting: Surgery

## 2015-11-04 ENCOUNTER — Encounter: Payer: Self-pay | Admitting: Emergency Medicine

## 2015-11-04 ENCOUNTER — Inpatient Hospital Stay
Admission: EM | Admit: 2015-11-04 | Discharge: 2015-11-11 | DRG: 392 | Disposition: A | Discharge status: Home / Self Care | Payer: BLUE CROSS/BLUE SHIELD | Attending: General Surgery | Admitting: General Surgery

## 2015-11-04 ENCOUNTER — Telehealth: Payer: Self-pay

## 2015-11-04 DIAGNOSIS — I10 Essential (primary) hypertension: Secondary | ICD-10-CM | POA: Diagnosis present

## 2015-11-04 DIAGNOSIS — K5720 Diverticulitis of large intestine with perforation and abscess without bleeding: Secondary | ICD-10-CM | POA: Diagnosis present

## 2015-11-04 DIAGNOSIS — K651 Peritoneal abscess: Secondary | ICD-10-CM | POA: Insufficient documentation

## 2015-11-04 DIAGNOSIS — L0291 Cutaneous abscess, unspecified: Secondary | ICD-10-CM | POA: Diagnosis not present

## 2015-11-04 DIAGNOSIS — E119 Type 2 diabetes mellitus without complications: Secondary | ICD-10-CM | POA: Diagnosis present

## 2015-11-04 DIAGNOSIS — Z1623 Resistance to quinolones and fluoroquinolones: Secondary | ICD-10-CM | POA: Diagnosis present

## 2015-11-04 DIAGNOSIS — E876 Hypokalemia: Secondary | ICD-10-CM | POA: Diagnosis present

## 2015-11-04 DIAGNOSIS — Z1611 Resistance to penicillins: Secondary | ICD-10-CM | POA: Diagnosis present

## 2015-11-04 DIAGNOSIS — Z79899 Other long term (current) drug therapy: Secondary | ICD-10-CM | POA: Diagnosis not present

## 2015-11-04 DIAGNOSIS — J189 Pneumonia, unspecified organism: Secondary | ICD-10-CM

## 2015-11-04 DIAGNOSIS — Z452 Encounter for adjustment and management of vascular access device: Secondary | ICD-10-CM

## 2015-11-04 DIAGNOSIS — N739 Female pelvic inflammatory disease, unspecified: Secondary | ICD-10-CM

## 2015-11-04 DIAGNOSIS — K5792 Diverticulitis of intestine, part unspecified, without perforation or abscess without bleeding: Secondary | ICD-10-CM | POA: Diagnosis present

## 2015-11-04 DIAGNOSIS — Z79891 Long term (current) use of opiate analgesic: Secondary | ICD-10-CM | POA: Diagnosis not present

## 2015-11-04 DIAGNOSIS — R109 Unspecified abdominal pain: Secondary | ICD-10-CM

## 2015-11-04 DIAGNOSIS — Z7984 Long term (current) use of oral hypoglycemic drugs: Secondary | ICD-10-CM

## 2015-11-04 DIAGNOSIS — B9620 Unspecified Escherichia coli [E. coli] as the cause of diseases classified elsewhere: Secondary | ICD-10-CM | POA: Diagnosis present

## 2015-11-04 DIAGNOSIS — K5732 Diverticulitis of large intestine without perforation or abscess without bleeding: Secondary | ICD-10-CM

## 2015-11-04 HISTORY — DX: Type 2 diabetes mellitus without complications: E11.9

## 2015-11-04 LAB — URINALYSIS COMPLETE WITH MICROSCOPIC (ARMC ONLY)
Bilirubin Urine: NEGATIVE
Glucose, UA: NEGATIVE mg/dL
LEUKOCYTES UA: NEGATIVE
NITRITE: NEGATIVE
PROTEIN: NEGATIVE mg/dL
SPECIFIC GRAVITY, URINE: 1.014 (ref 1.005–1.030)
pH: 7 (ref 5.0–8.0)

## 2015-11-04 LAB — COMPREHENSIVE METABOLIC PANEL
ALK PHOS: 74 U/L (ref 38–126)
ALT: 73 U/L — ABNORMAL HIGH (ref 14–54)
ANION GAP: 12 (ref 5–15)
AST: 56 U/L — ABNORMAL HIGH (ref 15–41)
Albumin: 3.2 g/dL — ABNORMAL LOW (ref 3.5–5.0)
BILIRUBIN TOTAL: 0.7 mg/dL (ref 0.3–1.2)
BUN: 7 mg/dL (ref 6–20)
CALCIUM: 8.2 mg/dL — AB (ref 8.9–10.3)
CO2: 30 mmol/L (ref 22–32)
Chloride: 93 mmol/L — ABNORMAL LOW (ref 101–111)
Creatinine, Ser: 0.6 mg/dL (ref 0.44–1.00)
GLUCOSE: 87 mg/dL (ref 65–99)
POTASSIUM: 2.9 mmol/L — AB (ref 3.5–5.1)
Sodium: 135 mmol/L (ref 135–145)
TOTAL PROTEIN: 7.3 g/dL (ref 6.5–8.1)

## 2015-11-04 LAB — CBC
HCT: 32.4 % — ABNORMAL LOW (ref 35.0–47.0)
HEMOGLOBIN: 10.7 g/dL — AB (ref 12.0–16.0)
MCH: 28.3 pg (ref 26.0–34.0)
MCHC: 32.9 g/dL (ref 32.0–36.0)
MCV: 86.3 fL (ref 80.0–100.0)
Platelets: 347 10*3/uL (ref 150–440)
RBC: 3.76 MIL/uL — AB (ref 3.80–5.20)
RDW: 16.5 % — ABNORMAL HIGH (ref 11.5–14.5)
WBC: 16 10*3/uL — AB (ref 3.6–11.0)

## 2015-11-04 LAB — LIPASE, BLOOD: Lipase: 22 U/L (ref 11–51)

## 2015-11-04 LAB — POCT PREGNANCY, URINE: Preg Test, Ur: NEGATIVE

## 2015-11-04 MED ORDER — DIPHENHYDRAMINE HCL 50 MG/ML IJ SOLN
25.0000 mg | Freq: Four times a day (QID) | INTRAMUSCULAR | Status: DC | PRN
  Administered 2015-11-09: 25 mg via INTRAVENOUS
  Filled 2015-11-04: qty 1

## 2015-11-04 MED ORDER — MORPHINE SULFATE (PF) 4 MG/ML IV SOLN
4.0000 mg | Freq: Once | INTRAVENOUS | Status: AC
  Administered 2015-11-04: 4 mg via INTRAVENOUS
  Filled 2015-11-04: qty 1

## 2015-11-04 MED ORDER — ONDANSETRON HCL 4 MG/2ML IJ SOLN
4.0000 mg | Freq: Once | INTRAMUSCULAR | Status: AC
  Administered 2015-11-04: 4 mg via INTRAVENOUS
  Filled 2015-11-04: qty 2

## 2015-11-04 MED ORDER — ONDANSETRON HCL 4 MG/2ML IJ SOLN
4.0000 mg | Freq: Four times a day (QID) | INTRAMUSCULAR | Status: DC | PRN
  Administered 2015-11-05 – 2015-11-11 (×9): 4 mg via INTRAVENOUS
  Filled 2015-11-04 (×10): qty 2

## 2015-11-04 MED ORDER — IOHEXOL 240 MG/ML SOLN
25.0000 mL | Freq: Once | INTRAMUSCULAR | Status: AC | PRN
  Administered 2015-11-04: 25 mL via ORAL

## 2015-11-04 MED ORDER — DIPHENHYDRAMINE HCL 25 MG PO CAPS: 25.0000 mg | ORAL_CAPSULE | Freq: Four times a day (QID) | ORAL | Status: DC | PRN

## 2015-11-04 MED ORDER — DEXTROSE 5 % IV SOLN
2.0000 g | Freq: Three times a day (TID) | INTRAVENOUS | Status: DC
  Administered 2015-11-04 – 2015-11-07 (×8): 2 g via INTRAVENOUS
  Filled 2015-11-04 (×11): qty 2

## 2015-11-04 MED ORDER — IOHEXOL 300 MG/ML  SOLN
100.0000 mL | Freq: Once | INTRAMUSCULAR | Status: AC | PRN
  Administered 2015-11-04: 100 mL via INTRAVENOUS

## 2015-11-04 MED ORDER — HYDRALAZINE HCL 20 MG/ML IJ SOLN: 10.0000 mg | INTRAMUSCULAR | Status: DC | PRN

## 2015-11-04 MED ORDER — KCL IN DEXTROSE-NACL 20-5-0.45 MEQ/L-%-% IV SOLN
INTRAVENOUS | Status: DC
  Administered 2015-11-04: 23:00:00 via INTRAVENOUS
  Administered 2015-11-05 – 2015-11-06 (×2): 1000 mL via INTRAVENOUS
  Administered 2015-11-06 – 2015-11-10 (×7): via INTRAVENOUS
  Filled 2015-11-04 (×18): qty 1000

## 2015-11-04 MED ORDER — METRONIDAZOLE IN NACL 5-0.79 MG/ML-% IV SOLN
500.0000 mg | Freq: Once | INTRAVENOUS | Status: AC
  Administered 2015-11-04: 500 mg via INTRAVENOUS
  Filled 2015-11-04: qty 100

## 2015-11-04 MED ORDER — MORPHINE SULFATE (PF) 4 MG/ML IV SOLN
4.0000 mg | INTRAVENOUS | Status: DC | PRN
  Administered 2015-11-04 – 2015-11-06 (×9): 4 mg via INTRAVENOUS
  Filled 2015-11-04 (×8): qty 1

## 2015-11-04 MED ORDER — INSULIN ASPART 100 UNIT/ML ~~LOC~~ SOLN
0.0000 [IU] | SUBCUTANEOUS | Status: DC
  Administered 2015-11-05 – 2015-11-07 (×7): 3 [IU] via SUBCUTANEOUS
  Filled 2015-11-04 (×7): qty 3

## 2015-11-04 MED ORDER — CIPROFLOXACIN IN D5W 400 MG/200ML IV SOLN
400.0000 mg | Freq: Once | INTRAVENOUS | Status: AC
  Administered 2015-11-04: 400 mg via INTRAVENOUS
  Filled 2015-11-04: qty 200

## 2015-11-04 MED ORDER — ONDANSETRON 8 MG PO TBDP
4.0000 mg | ORAL_TABLET | Freq: Four times a day (QID) | ORAL | Status: DC | PRN
  Administered 2015-11-06: 4 mg via ORAL
  Filled 2015-11-04: qty 1

## 2015-11-04 MED ORDER — PANTOPRAZOLE SODIUM 40 MG IV SOLR
40.0000 mg | Freq: Every day | INTRAVENOUS | Status: DC
  Administered 2015-11-04 – 2015-11-10 (×7): 40 mg via INTRAVENOUS
  Filled 2015-11-04 (×7): qty 40

## 2015-11-04 MED ORDER — METRONIDAZOLE IN NACL 5-0.79 MG/ML-% IV SOLN
500.0000 mg | Freq: Three times a day (TID) | INTRAVENOUS | Status: DC
  Administered 2015-11-05 – 2015-11-07 (×7): 500 mg via INTRAVENOUS
  Filled 2015-11-04 (×10): qty 100

## 2015-11-04 NOTE — ED Notes (Signed)
MD Goodman at bedside. 

## 2015-11-04 NOTE — ED Notes (Signed)
Spoke to Dr Scotty Court about pt's condition; verbal orders for repeat blood work and  zofran IV  morphine IV.

## 2015-11-04 NOTE — ED Notes (Addendum)
Pt sent here by Dr Excell Seltzer; dc Sunday after 5 days of diverticulitis and perf bowel. Pt reports continued abdominal pain. Pt reports taking  norco at 1645. Pt also reports blood in urine since Monday.

## 2015-11-04 NOTE — ED Provider Notes (Signed)
[facility] Emergency Department Provider Note   ____________________________________________  Time seen: ~1940  I have reviewed the triage vital signs and the nursing notes.   HISTORY  Chief Complaint Abdominal Pain   History limited by: Not Limited   HPI Maria Mcbride is a 40 y.o. female who presents to the emergency department today because of concerns for abdominal pain. The patient was recently admitted to the hospital for diverticulitis. She was under the care of the surgery team secondary to a micro-perforation. She did not require an operation. She was treated with IV antibiotics. After going home she was feeling better however quickly started having pain again. She states she has been having pain in the left lower quadrant which is where she had pain with her diverticulitis. Additionally the patient states she is also been having pain now in the right lower quadrant. It is severe. It has been associated with some bloody urine. She does feel that the pain is worse with urination or defecation. She denies any fevers. She has been using the pain medication that was prescribed with minimal relief.     Past Medical History  Diagnosis Date  . Hypertension     Patient Active Problem List   Diagnosis Date Noted  . Hypoxemia   . Diverticulitis 10/27/2015  . Diverticulitis of large intestine with perforation without bleeding     History reviewed. No pertinent past surgical history.  Current Outpatient Rx  Name  Route  Sig  Dispense  Refill  . amoxicillin-clavulanate (AUGMENTIN) 875-125 MG tablet   Oral   Take 1 tablet by mouth 2 (two) times daily.   20 tablet   1   . HYDROcodone-acetaminophen (NORCO/VICODIN) 5-325 MG tablet   Oral   Take 1-2 tablets by mouth every 4 (four) hours as needed for moderate pain.   30 tablet   0   . metFORMIN (GLUCOPHAGE-XR) 500 MG 24 hr tablet   Oral   Take 500 mg by mouth 2 (two) times daily.          Marland Kitchen  olmesartan (BENICAR) 20 MG tablet   Oral   Take 20 mg by mouth daily.         . ondansetron (ZOFRAN ODT) 4 MG disintegrating tablet   Oral   Take 1 tablet (4 mg total) by mouth every 8 (eight) hours as needed for nausea or vomiting.   20 tablet   0     Allergies Review of patient's allergies indicates no known allergies.  No family history on file.  Social History Social History  Substance Use Topics  . Smoking status: Never Smoker   . Smokeless tobacco: Never Used  . Alcohol Use: No    Review of Systems  Constitutional: Negative for fever. Cardiovascular: Negative for chest pain. Respiratory: Negative for shortness of breath. Gastrointestinal: Positive for abdominal pain Neurological: Negative for headaches, focal weakness or numbness.   10-point ROS otherwise negative.  ____________________________________________   PHYSICAL EXAM:  VITAL SIGNS: ED Triage Vitals  Enc Vitals Group     BP 11/04/15 1744 129/84 mmHg     Pulse Rate 11/04/15 1744 103     Resp 11/04/15 1744 16     Temp 11/04/15 1744 99 F (37.2 C)     Temp Source 11/04/15 1744 Oral     SpO2 11/04/15 1744 96 %     Weight 11/04/15 1744 194 lb (87.998 kg)     Height 11/04/15 1744  (1.575 m)  Head Cir --      Peak Flow --      Pain Score 11/04/15 1743 6   Constitutional: Alert and oriented. Well appearing and in no distress. Eyes: Conjunctivae are normal. PERRL. Normal extraocular movements. ENT   Head: Normocephalic and atraumatic.   Nose: No congestion/rhinnorhea.   Mouth/Throat: Mucous membranes are moist.   Neck: No stridor. Hematological/Lymphatic/Immunilogical: No cervical lymphadenopathy. Cardiovascular: Normal rate, regular rhythm.  No murmurs, rubs, or gallops. Respiratory: Normal respiratory effort without tachypnea nor retractions. Breath sounds are clear and equal bilaterally. No wheezes/rales/rhonchi. Gastrointestinal: Soft. Tender to palpation somewhat  diffusely however worse in the lower abdomen. No rebound. No guarding. Genitourinary: Deferred Musculoskeletal: Normal range of motion in all extremities. No joint effusions.  No lower extremity tenderness nor edema. Neurologic:  Normal speech and language. No gross focal neurologic deficits are appreciated.  Skin:  Skin is warm, dry and intact. No rash noted. Psychiatric: Mood and affect are normal. Speech and behavior are normal. Patient exhibits appropriate insight and judgment.  ____________________________________________    LABS (pertinent positives/negatives)  Labs Reviewed  COMPREHENSIVE METABOLIC PANEL - Abnormal; Notable for the following:    Potassium 2.9 (*)    Chloride 93 (*)    Calcium 8.2 (*)    Albumin 3.2 (*)    AST 56 (*)    ALT 73 (*)    All other components within normal limits  CBC - Abnormal; Notable for the following:    WBC 16.0 (*)    RBC 3.76 (*)    Hemoglobin 10.7 (*)    HCT 32.4 (*)    RDW 16.5 (*)    All other components within normal limits  URINALYSIS COMPLETEWITH MICROSCOPIC ([facility] ONLY) - Abnormal; Notable for the following:    Color, Urine YELLOW (*)    APPearance HAZY (*)    Ketones, ur 2+ (*)    Hgb urine dipstick 3+ (*)    Bacteria, UA RARE (*)    Squamous Epithelial / LPF 0-5 (*)    All other components within normal limits  LIPASE, BLOOD  COMPREHENSIVE METABOLIC PANEL  MAGNESIUM  PHOSPHORUS  CBC  APTT  PROTIME-INR  POC URINE PREG, ED  POCT PREGNANCY, URINE     ____________________________________________   EKG  None  ____________________________________________    RADIOLOGY  CT abd/pel IMPRESSION: 1. Interval Development of multiple rim enhancing fluid collections in the pelvis, compatible with multi focal abscesses. These are localized in the cul-de-sac, root of the sigmoid mesocolon, and in the mesentery. Despite the relatively advanced edema in the pelvic mesentery and pelvic floor, the sigmoid colon does not  show substantial wall thickening in the area of the previously characterized diverticulitis. 2. Slight progression of intraperitoneal free air. 3. Interval development of right greater than left lower lobe collapse/consolidation with small right pleural effusion.  I, Phineas Semen, personally discussed these images and results by phone with the on-call radiologist and used this discussion as part of my medical decision making.   ____________________________________________   PROCEDURES  Procedure(s) performed: None  Critical Care performed: No  ____________________________________________   INITIAL IMPRESSION / ASSESSMENT AND PLAN / ED COURSE  Pertinent labs & imaging results that were available during my care of the patient were reviewed by me and considered in my medical decision making (see chart for details).  Patient had a recent hospitalization for diverticulitis presents to the emergency department today because of concerns for increasing abdominal pain. The patient had a elevated white blood cell  count on today's blood work. A repeat CT scan is concerning for multifocal abscesses. Because of this patient was placed on antibiotics. Will be admitted to the surgery service.  ____________________________________________   FINAL CLINICAL IMPRESSION(S) / ED DIAGNOSES  Final diagnoses:  Abdominal pain, unspecified abdominal location  Abscess of abdominal cavity (HCC)     Phineas Semen, MD 11/04/15 2320

## 2015-11-04 NOTE — H&P (Signed)
Patient ID: Maria Mcbride, female   DOB: 10/01/1975, 40 y.o.   MRN: [id number]  CC: ABDOMINAL PAIN  HPI ABREE Mcbride is a 40 y.o. female who presents emergency department for evaluation of abdominal pain. Patient was discharged from the hospital 4 days prior after inpatient treatment for diverticulitis with microperforation. She states that her pain never fully went away during her last admission and has gradually worsened since discharge. The pain is primarily located in the lower abdomen worse on the right than the left. The pain is worsened by straining for bowel movements or urination. It is improved with pain medication. She denies any fevers, chills, nausea, vomiting. She has been able to eat today and had a bowel movement earlier today that was soft but not loose. She states the pain was severe when she presented with emerged department but hasn't improved with IV pain medications.  HPI  Past Medical History  Diagnosis Date  . Hypertension    patient is diabetic and has hypertension    History reviewed. Patient has had C-sections 2.  No family history on file.  Social History Social History  Substance Use Topics  . Smoking status: Never Smoker   . Smokeless tobacco: Never Used  . Alcohol Use: No    No Known Allergies  Current Facility-Administered Medications  Medication Dose Route Frequency Provider Last Rate Last Dose  . ciprofloxacin (CIPRO) IVPB 400 mg  400 mg Intravenous Once Maria Semen, MD 200 mL/hr at 11/04/15 2112 400 mg at 11/04/15 2112  . metroNIDAZOLE (FLAGYL) IVPB 500 mg  500 mg Intravenous Once Maria Semen, MD 100 mL/hr at 11/04/15 2100 500 mg at 11/04/15 2100   Current Outpatient Prescriptions  Medication Sig Dispense Refill  . amoxicillin-clavulanate (AUGMENTIN) 875-125 MG tablet Take 1 tablet by mouth 2 (two) times daily. 20 tablet 1  . HYDROcodone-acetaminophen (NORCO/VICODIN) 5-325 MG tablet Take 1-2 tablets by mouth every 4 (four) hours as  needed for moderate pain. 30 tablet 0  . metFORMIN (GLUCOPHAGE-XR) 500 MG 24 hr tablet Take 500 mg by mouth 2 (two) times daily.     . ondansetron (ZOFRAN ODT) 4 MG disintegrating tablet Take 1 tablet (4 mg total) by mouth every 8 (eight) hours as needed for nausea or vomiting. 20 tablet 0     Review of Systems A multi-point review of systems was asked and was negative except for the findings documented in the history of present illness  Physical Exam Blood pressure 133/88, pulse 98, temperature 99 F (37.2 C), temperature source Oral, resp. rate 16, height  (1.575 m), weight 87.998 kg (194 lb), last menstrual period 10/27/2015, SpO2 94 %. CONSTITUTIONAL: Resting in bed in no acute distress. EYES: Pupils are equal, round, and reactive to light, Sclera are non-icteric. EARS, NOSE, MOUTH AND THROAT: The oropharynx is clear. The oral mucosa is pink and moist. Hearing is intact to voice. LYMPH NODES:  Lymph nodes in the neck are normal. RESPIRATORY:  Lungs are clear. There is normal respiratory effort, with equal breath sounds bilaterally, and without pathologic use of accessory muscles. CARDIOVASCULAR: Heart is tachycardic GI: The abdomen is large, soft, focally tender in the right lower quadrant, and nondistended. Patient does not have any evidence of peritonitis on exam and the abdomen is without tympany. There is no guarding or rigidity There are no palpable masses. There is no hepatosplenomegaly. There are normal bowel sounds in all quadrants. GU: Rectal deferred.   MUSCULOSKELETAL: Normal muscle strength and tone. No cyanosis  or edema.   SKIN: Turgor is good. There is ecchymosis in the right lower quadrant from prior heparin injections. NEUROLOGIC: Motor and sensation is grossly normal. Cranial nerves are grossly intact. PSYCH:  Oriented to person, place and time. Affect is normal.  Data Reviewed I reviewed the patient's images and labs. Labs are concerning for leukocytosis of 16,000  as well as a hypokalemia at 2.9. CT scan does show evidence of pericolonic abscesses of the lower pelvis. There is also a slight amount of visible free air in the upper abdomen on CT scan. I have personally reviewed the patient's imaging, laboratory findings and medical records.    Assessment    Diverticulitis with abscesses    Plan    Patient has failed outpatient therapy for diverticulitis. Now she presents with multiple pelvic abscesses. Since she is without obvious peritonitis offered her the option of attempting IV antibiotics and interventional radiology placed drains. Discussed in detail that the drain placement may not be successful and she may still require an emergent operation for her diverticulitis. Patient and her husband voiced understanding of this. Plan will be for admission, IV antibiotics, as needed pain and nausea medication, nothing by mouth. We'll ask radiology to place drains via CT guidance in the pelvis tomorrow morning.     Time spent with the patient was 60 minutes, with more than 50% of the time spent in face-to-face education, counseling and care coordination.     Ricarda Frame, MD FACS General Surgeon 11/04/2015, 9:35 PM

## 2015-11-04 NOTE — Telephone Encounter (Signed)
FMLA Form was filled out and faxed. 

## 2015-11-04 NOTE — Telephone Encounter (Signed)
Patient has called and states that she is in severe pain when urinating and severely with bowl movement. Patient states that she is having to take 1 pain medication every 4 hours. She says she does not like to take 2 because this makes her too tired. She is crying on the phone in pain and unsure what to do. Patient would like to be called back.

## 2015-11-04 NOTE — Telephone Encounter (Signed)
Spoke with patient yesterday in regards to vaginal bleeding/hematuria. Patient called [facility] and cannot be seen in their office until next week.  She goes on to say that LLQ pain is much worse today than yesterday. Seems like it gets the worst at night. Denies fever. Has nausea but denies vomiting. States that she has had nausea while taking pain medication since being discharged. She feels like she continues to get worse each day and the pain that she had prior to going to the hospital is back.   Patient was told to go to the Emergency Room at this time and that we have a surgeon on call at all times that can come down and see her if needed.   She verbalizes understanding and is on her way to the ER.

## 2015-11-05 ENCOUNTER — Inpatient Hospital Stay: Payer: BLUE CROSS/BLUE SHIELD

## 2015-11-05 DIAGNOSIS — K5720 Diverticulitis of large intestine with perforation and abscess without bleeding: Principal | ICD-10-CM

## 2015-11-05 LAB — COMPREHENSIVE METABOLIC PANEL
ALT: 71 U/L — ABNORMAL HIGH (ref 14–54)
AST: 48 U/L — AB (ref 15–41)
Albumin: 2.9 g/dL — ABNORMAL LOW (ref 3.5–5.0)
Alkaline Phosphatase: 63 U/L (ref 38–126)
Anion gap: 10 (ref 5–15)
BILIRUBIN TOTAL: 0.6 mg/dL (ref 0.3–1.2)
CO2: 31 mmol/L (ref 22–32)
CREATININE: 0.61 mg/dL (ref 0.44–1.00)
Calcium: 7.8 mg/dL — ABNORMAL LOW (ref 8.9–10.3)
Chloride: 93 mmol/L — ABNORMAL LOW (ref 101–111)
Glucose, Bld: 121 mg/dL — ABNORMAL HIGH (ref 65–99)
POTASSIUM: 2.7 mmol/L — AB (ref 3.5–5.1)
Sodium: 134 mmol/L — ABNORMAL LOW (ref 135–145)
TOTAL PROTEIN: 6.8 g/dL (ref 6.5–8.1)

## 2015-11-05 LAB — CBC
HCT: 30.8 % — ABNORMAL LOW (ref 35.0–47.0)
Hemoglobin: 10.1 g/dL — ABNORMAL LOW (ref 12.0–16.0)
MCH: 28.3 pg (ref 26.0–34.0)
MCHC: 32.9 g/dL (ref 32.0–36.0)
MCV: 86.1 fL (ref 80.0–100.0)
Platelets: 317 10*3/uL (ref 150–440)
RBC: 3.57 MIL/uL — AB (ref 3.80–5.20)
RDW: 16.6 % — ABNORMAL HIGH (ref 11.5–14.5)
WBC: 14.4 10*3/uL — ABNORMAL HIGH (ref 3.6–11.0)

## 2015-11-05 LAB — GLUCOSE, CAPILLARY
GLUCOSE-CAPILLARY: 110 mg/dL — AB (ref 65–99)
GLUCOSE-CAPILLARY: 117 mg/dL — AB (ref 65–99)
GLUCOSE-CAPILLARY: 131 mg/dL — AB (ref 65–99)
GLUCOSE-CAPILLARY: 86 mg/dL (ref 65–99)
Glucose-Capillary: 137 mg/dL — ABNORMAL HIGH (ref 65–99)
Glucose-Capillary: 121 mg/dL — ABNORMAL HIGH (ref 65–99)
Glucose-Capillary: 105 mg/dL — ABNORMAL HIGH (ref 65–99)

## 2015-11-05 LAB — PHOSPHORUS: PHOSPHORUS: 3.3 mg/dL (ref 2.5–4.6)

## 2015-11-05 LAB — MAGNESIUM: MAGNESIUM: 2.2 mg/dL (ref 1.7–2.4)

## 2015-11-05 LAB — PROTIME-INR
INR: 1.15
Prothrombin Time: 14.9 seconds (ref 11.4–15.0)

## 2015-11-05 LAB — APTT: aPTT: 42 seconds — ABNORMAL HIGH (ref 24–36)

## 2015-11-05 MED ORDER — SODIUM CHLORIDE FLUSH 0.9 % IV SOLN
INTRAVENOUS | Status: AC
  Administered 2015-11-05: 17:00:00
  Filled 2015-11-05: qty 10

## 2015-11-05 MED ORDER — MIDAZOLAM HCL 5 MG/5ML IJ SOLN
INTRAMUSCULAR | Status: AC
  Filled 2015-11-05: qty 10

## 2015-11-05 MED ORDER — FENTANYL CITRATE (PF) 100 MCG/2ML IJ SOLN
INTRAMUSCULAR | Status: AC | PRN
  Administered 2015-11-05 (×3): 50 ug via INTRAVENOUS

## 2015-11-05 MED ORDER — MORPHINE SULFATE (PF) 10 MG/ML IV SOLN
INTRAVENOUS | Status: AC
  Filled 2015-11-05: qty 1

## 2015-11-05 MED ORDER — POTASSIUM CHLORIDE CRYS ER 20 MEQ PO TBCR
40.0000 meq | EXTENDED_RELEASE_TABLET | Freq: Once | ORAL | Status: AC
  Administered 2015-11-05: 40 meq via ORAL
  Filled 2015-11-05: qty 2

## 2015-11-05 MED ORDER — POTASSIUM CHLORIDE 10 MEQ/100ML IV SOLN
10.0000 meq | INTRAVENOUS | Status: AC
  Administered 2015-11-05 (×4): 10 meq via INTRAVENOUS
  Filled 2015-11-05 (×4): qty 100

## 2015-11-05 MED ORDER — FENTANYL CITRATE (PF) 100 MCG/2ML IJ SOLN
INTRAMUSCULAR | Status: AC
  Filled 2015-11-05: qty 4

## 2015-11-05 MED ORDER — MIDAZOLAM HCL 2 MG/2ML IJ SOLN
INTRAMUSCULAR | Status: AC | PRN
  Administered 2015-11-05 (×3): 1 mg via INTRAVENOUS

## 2015-11-05 NOTE — Progress Notes (Signed)
Patient taken to special procedures, patient is alert and oriented times four, no acute distress noted. Patient has been NPO for the procedure. Patent mother at the bedside

## 2015-11-05 NOTE — Progress Notes (Signed)
Dr. Everlene Farrier called this AM and notified of potassium blood level of 2.7 orders received and carried out as ordered

## 2015-11-05 NOTE — Procedures (Signed)
Interventional Radiology Procedure Note  Procedure: Placement of a left trans-gluteal 54F drainage catheter into largest formed fluid collection of the abdomen/pelvis.  Complex fluid evacuated for sample.  Gravity drainage.  Complications: None Recommendations:  - 1 hour recovery post sedation - Twice a day sterile saline flush - record output each shift - Do not submerge  - Routine wound care  - follow up cultures  Signed,  Yvone Neu. Loreta Ave, DO

## 2015-11-05 NOTE — Progress Notes (Signed)
CC: Diverticulitis w multiple abscess and microperf Subjective: Feeling much better as compared to last night.  IR placed a transgluteal drain into the largest pelvic collection No N/V Afebrile WBC going down  Objective: Vital signs in last 24 hours: Temp:  [98.4 F (36.9 C)-99.4 F (37.4 C)] 98.4 F (36.9 C) (02/24 0907) Pulse Rate:  [90-108] 98 (02/24 1635) Resp:  [15-36] 15 (02/24 1635) BP: (116-154)/(65-97) 124/81 mmHg (02/24 1635) SpO2:  [92 %-100 %] 99 % (02/24 1635) Weight:  [87.998 kg (194 lb)] 87.998 kg (194 lb) (02/23 1744) Last BM Date: 11/04/15  Intake/Output from previous day: 02/23 0701 - 02/24 0700 In: 125 [I.V.:125] Out: 3 [Urine:3] Intake/Output this shift: Total I/O In: -  Out: 700 [Urine:700]  Physical exam: NAD awake alert, non toxic Chest: CTA, NSR Abd: soft, mildly TTP LLQ, no peritonitis Ext: well perfused, no edema Neuro GCS 145, motor and sensory intact. CN intact  Lab Results: CBC   Recent Labs  11/04/15 1748 11/05/15 0631  WBC 16.0* 14.4*  HGB 10.7* 10.1*  HCT 32.4* 30.8*  PLT 347 317   BMET  Recent Labs  11/04/15 1748 11/05/15 0631  NA 135 134*  K 2.9* 2.7*  CL 93* 93*  CO2 30 31  GLUCOSE 87 121*  BUN 7 <5*  CREATININE 0.60 0.61  CALCIUM 8.2* 7.8*   PT/INR  Recent Labs  11/05/15 0631  LABPROT 14.9  INR 1.15   ABG No results for input(s): PHART, HCO3 in the last 72 hours.  Invalid input(s): PCO2, PO2  Studies/Results: Ct Abdomen Pelvis W Contrast  11/04/2015  CLINICAL DATA:  Diverticulitis. Continued generalized abdominal pain. EXAM: CT ABDOMEN AND PELVIS WITH CONTRAST TECHNIQUE: Multidetector CT imaging of the abdomen and pelvis was performed using the standard protocol following bolus administration of intravenous contrast. CONTRAST:  OMNIPAQUE IOHEXOL 300 MG/ML  SOLN COMPARISON:  10/27/2015 FINDINGS: Lower chest: Interval development of bilateral lower lobe collapse/ consolidation, right greater than  left. Tiny right pleural effusion is associated. Hepatobiliary: No focal abnormality within the liver parenchyma. Gallbladder surgically absent. No intrahepatic or extrahepatic biliary dilation. Pancreas: No focal mass lesion. No dilatation of the main duct. No intraparenchymal cyst. No peripancreatic edema. Spleen: No splenomegaly. No focal mass lesion. Adrenals/Urinary Tract: No adrenal nodule or mass. Kidneys are unremarkable. No evidence for hydroureter. The urinary bladder appears normal for the degree of distention. Stomach/Bowel: Stomach is nondistended. No gastric wall thickening. No evidence of outlet obstruction. Duodenum is normally positioned as is the ligament of Treitz. No small bowel wall thickening. No small bowel dilatation. The terminal ileum is normal. The appendix is normal. Colon is decompressed with diverticular changes noted in the sigmoid segment. There is some pericolonic edema/inflammation in the region of diverticulitis seen previously. Vascular/Lymphatic: No abdominal aortic aneurysm. No abdominal atherosclerotic calcification. Interval increase in size of small retroperitoneal lymph nodes in the abdomen. Progression of small pelvic sidewall lymph nodes bilaterally is associated. Reproductive: IUD is visualized in the uterus. The left ovary becomes incorporated in the inflammatory process in the left lower quadrant. Other: Interval progression of intraperitoneal free air and fluid since the prior study. Multiple rim enhancing intraperitoneal fluid collections have developed since the prior study, suggesting abscess formation. 4.3 x 5.6 cm abscess is identified in the cul-de-sac. 5.3 x 3.6 cm abscess is identified in the root of the sigmoid mesocolon. Several areas of interloop mesenteric fluid are seen in the central anatomic pelvis including 1 adjacent to the right uterine fundus measuring  5.6 x 4.4 cm and compatible with abscess. Substantial interval increase in pelvic for edema as well  as mesenteric and omental edema. Intraperitoneal free air is identified in the abdomen, as before and appears to be slightly increased in the interval. Musculoskeletal: Bone windows reveal no worrisome lytic or sclerotic osseous lesions. IMPRESSION: 1. Interval Development of multiple rim enhancing fluid collections in the pelvis, compatible with multi focal abscesses. These are localized in the cul-de-sac, root of the sigmoid mesocolon, and in the mesentery. Despite the relatively advanced edema in the pelvic mesentery and pelvic floor, the sigmoid colon does not show substantial wall thickening in the area of the previously characterized diverticulitis. 2. Slight progression of intraperitoneal free air. 3. Interval development of right greater than left lower lobe collapse/consolidation with small right pleural effusion. I personally discussed these findings by telephone with Dr. Derrill Kay at approximately 2034 hours on 11/04/2015. Electronically Signed   By: Kennith Center M.D.   On: 11/04/2015 20:40    Anti-infectives: Anti-infectives    Start     Dose/Rate Route Frequency Ordered Stop   11/04/15 2215  ceFEPIme (MAXIPIME) 2 g in dextrose 5 % 50 mL IVPB     2 g 100 mL/hr over 30 Minutes Intravenous 3 times per day 11/04/15 2212     11/04/15 2212  metroNIDAZOLE (FLAGYL) IVPB 500 mg     500 mg 100 mL/hr over 60 Minutes Intravenous Every 8 hours 11/04/15 2212     11/04/15 2045  ciprofloxacin (CIPRO) IVPB 400 mg     400 mg 200 mL/hr over 60 Minutes Intravenous  Once 11/04/15 2034 11/04/15 2212   11/04/15 2045  metroNIDAZOLE (FLAGYL) IVPB 500 mg     500 mg 100 mL/hr over 60 Minutes Intravenous  Once 11/04/15 2034 11/04/15 2200      Assessment/Plan: Diverticulitis w multiple abscess and microperf Clinically suprisingly doing very well We will give her a trial with parenteral A/Bs NPO for now No need for immediate surgical indication If her condition deteriorates she will need a hartmann's . D/w  the pt in detail and she understands Continue to follow Place PICC line since she wil require prolonged A/Bs and she is a difficult stick \\Extensive  counseling provided Images personally reviewed and d/w the pt   Sterling Big, MD, [facility]  11/05/2015

## 2015-11-05 NOTE — Progress Notes (Signed)
Received patient back from special procedures, and patient presents with a transgluteal drain that is draining yellow secretions to drainage bag. Dressing to buttock intact and clean and dry. Patient is alert and oriented upon coming back to the floor. Vital signs stable. PICC line team will be here between 745-830 pm tonight. Consent already signed by patient.

## 2015-11-06 DIAGNOSIS — K651 Peritoneal abscess: Secondary | ICD-10-CM

## 2015-11-06 LAB — CBC
HCT: 29.9 % — ABNORMAL LOW (ref 35.0–47.0)
Hemoglobin: 9.8 g/dL — ABNORMAL LOW (ref 12.0–16.0)
MCH: 28.4 pg (ref 26.0–34.0)
MCHC: 32.8 g/dL (ref 32.0–36.0)
MCV: 86.7 fL (ref 80.0–100.0)
PLATELETS: 306 10*3/uL (ref 150–440)
RBC: 3.44 MIL/uL — AB (ref 3.80–5.20)
RDW: 16.6 % — AB (ref 11.5–14.5)
WBC: 13.5 10*3/uL — AB (ref 3.6–11.0)

## 2015-11-06 LAB — BASIC METABOLIC PANEL
ANION GAP: 6 (ref 5–15)
BUN: 6 mg/dL (ref 6–20)
CALCIUM: 7.3 mg/dL — AB (ref 8.9–10.3)
CO2: 27 mmol/L (ref 22–32)
Chloride: 99 mmol/L — ABNORMAL LOW (ref 101–111)
Creatinine, Ser: 0.55 mg/dL (ref 0.44–1.00)
Glucose, Bld: 120 mg/dL — ABNORMAL HIGH (ref 65–99)
POTASSIUM: 3.5 mmol/L (ref 3.5–5.1)
SODIUM: 132 mmol/L — AB (ref 135–145)

## 2015-11-06 LAB — GLUCOSE, CAPILLARY
GLUCOSE-CAPILLARY: 138 mg/dL — AB (ref 65–99)
GLUCOSE-CAPILLARY: 93 mg/dL (ref 65–99)
Glucose-Capillary: 108 mg/dL — ABNORMAL HIGH (ref 65–99)
Glucose-Capillary: 116 mg/dL — ABNORMAL HIGH (ref 65–99)
Glucose-Capillary: 131 mg/dL — ABNORMAL HIGH (ref 65–99)

## 2015-11-06 MED ORDER — HYDROCODONE-ACETAMINOPHEN 5-325 MG PO TABS
1.0000 | ORAL_TABLET | ORAL | Status: DC | PRN
  Administered 2015-11-06 – 2015-11-09 (×15): 1 via ORAL
  Filled 2015-11-06 (×9): qty 1
  Filled 2015-11-06: qty 2
  Filled 2015-11-06 (×5): qty 1

## 2015-11-06 MED ORDER — ENOXAPARIN SODIUM 40 MG/0.4ML ~~LOC~~ SOLN
40.0000 mg | SUBCUTANEOUS | Status: DC
  Administered 2015-11-06 – 2015-11-11 (×6): 40 mg via SUBCUTANEOUS
  Filled 2015-11-06 (×6): qty 0.4

## 2015-11-06 MED ORDER — ENOXAPARIN SODIUM 30 MG/0.3ML ~~LOC~~ SOLN: 30.0000 mg | SUBCUTANEOUS | Status: DC

## 2015-11-06 NOTE — Progress Notes (Signed)
CC: Diverticulitis Subjective: Patient states she is continuing to feel much better. Her only pain, today is from the drain insertion point on her left gluteus. She denies any fevers, chills, nausea, vomiting. Her abdominal pain is much improved.  Objective: Vital signs in last 24 hours: Temp:  [98.4 F (36.9 C)-98.6 F (37 C)] 98.5 F (36.9 C) (02/25 1357) Pulse Rate:  [92-102] 92 (02/25 1357) Resp:  [16-20] 16 (02/25 1357) BP: (107-122)/(61-76) 107/61 mmHg (02/25 1357) SpO2:  [97 %-98 %] 97 % (02/25 1357) Last BM Date: 11/06/15  Intake/Output from previous day: 02/24 0701 - 02/25 0700 In: 2917.4 [I.V.:2767.4; IV Piggyback:150] Out: 1630 [Urine:1600; Drains:30] Intake/Output this shift: Total I/O In: 1444.2 [I.V.:1294.2; IV Piggyback:150] Out: 315 [Urine:300; Drains:15]  Physical exam:  Gen.: No acute distress Chest: Clear to auscultation Heart: Regular rate and rhythm Abdomen: Soft, nondistended, minimally tender to palpation in the lower abdomen, active bowel sounds in all quadrants. Skin: Drain insertion point on left gluteus with dressing intact draining a seropurulent fluid.  Lab Results: CBC   Recent Labs  11/05/15 0631 11/06/15 0635  WBC 14.4* 13.5*  HGB 10.1* 9.8*  HCT 30.8* 29.9*  PLT 317 306   BMET  Recent Labs  11/05/15 0631 11/06/15 0635  NA 134* 132*  K 2.7* 3.5  CL 93* 99*  CO2 31 27  GLUCOSE 121* 120*  BUN <5* 6  CREATININE 0.61 0.55  CALCIUM 7.8* 7.3*   PT/INR  Recent Labs  11/05/15 0631  LABPROT 14.9  INR 1.15   ABG No results for input(s): PHART, HCO3 in the last 72 hours.  Invalid input(s): PCO2, PO2  Studies/Results: Ct Abdomen Pelvis W Contrast  11/04/2015  CLINICAL DATA:  Diverticulitis. Continued generalized abdominal pain. EXAM: CT ABDOMEN AND PELVIS WITH CONTRAST TECHNIQUE: Multidetector CT imaging of the abdomen and pelvis was performed using the standard protocol following bolus administration of intravenous  contrast. CONTRAST:  OMNIPAQUE IOHEXOL 300 MG/ML  SOLN COMPARISON:  10/27/2015 FINDINGS: Lower chest: Interval development of bilateral lower lobe collapse/ consolidation, right greater than left. Tiny right pleural effusion is associated. Hepatobiliary: No focal abnormality within the liver parenchyma. Gallbladder surgically absent. No intrahepatic or extrahepatic biliary dilation. Pancreas: No focal mass lesion. No dilatation of the main duct. No intraparenchymal cyst. No peripancreatic edema. Spleen: No splenomegaly. No focal mass lesion. Adrenals/Urinary Tract: No adrenal nodule or mass. Kidneys are unremarkable. No evidence for hydroureter. The urinary bladder appears normal for the degree of distention. Stomach/Bowel: Stomach is nondistended. No gastric wall thickening. No evidence of outlet obstruction. Duodenum is normally positioned as is the ligament of Treitz. No small bowel wall thickening. No small bowel dilatation. The terminal ileum is normal. The appendix is normal. Colon is decompressed with diverticular changes noted in the sigmoid segment. There is some pericolonic edema/inflammation in the region of diverticulitis seen previously. Vascular/Lymphatic: No abdominal aortic aneurysm. No abdominal atherosclerotic calcification. Interval increase in size of small retroperitoneal lymph nodes in the abdomen. Progression of small pelvic sidewall lymph nodes bilaterally is associated. Reproductive: IUD is visualized in the uterus. The left ovary becomes incorporated in the inflammatory process in the left lower quadrant. Other: Interval progression of intraperitoneal free air and fluid since the prior study. Multiple rim enhancing intraperitoneal fluid collections have developed since the prior study, suggesting abscess formation. 4.3 x 5.6 cm abscess is identified in the cul-de-sac. 5.3 x 3.6 cm abscess is identified in the root of the sigmoid mesocolon. Several areas of interloop mesenteric fluid  are seen in the central anatomic pelvis including 1 adjacent to the right uterine fundus measuring 5.6 x 4.4 cm and compatible with abscess. Substantial interval increase in pelvic for edema as well as mesenteric and omental edema. Intraperitoneal free air is identified in the abdomen, as before and appears to be slightly increased in the interval. Musculoskeletal: Bone windows reveal no worrisome lytic or sclerotic osseous lesions. IMPRESSION: 1. Interval Development of multiple rim enhancing fluid collections in the pelvis, compatible with multi focal abscesses. These are localized in the cul-de-sac, root of the sigmoid mesocolon, and in the mesentery. Despite the relatively advanced edema in the pelvic mesentery and pelvic floor, the sigmoid colon does not show substantial wall thickening in the area of the previously characterized diverticulitis. 2. Slight progression of intraperitoneal free air. 3. Interval development of right greater than left lower lobe collapse/consolidation with small right pleural effusion. I personally discussed these findings by telephone with Dr. Derrill Kay at approximately 2034 hours on 11/04/2015. Electronically Signed   By: Kennith Center M.D.   On: 11/04/2015 20:40   Dg Chest Port 1 View  11/05/2015  CLINICAL DATA:  PICC line adjustment EXAM: PORTABLE CHEST 1 VIEW COMPARISON:  11/05/2015 FINDINGS: Left PICC line has been retracted with the tip now in the lower SVC near the cavoatrial junction. There is right base atelectasis or infiltrate. Left lung is clear. Heart is normal size. No effusions or acute bony abnormality. IMPRESSION: Left PICC line tip in the lower SVC near the cavoatrial junction. Right base atelectasis or infiltrate. Electronically Signed   By: Charlett Nose M.D.   On: 11/05/2015 21:47   Dg Chest Port 1 View  11/05/2015  CLINICAL DATA:  Status post PICC line placement EXAM: PORTABLE CHEST 1 VIEW COMPARISON:  10/30/2015 FINDINGS: Cardiac shadow is within normal  limits. The lungs are clear. A new left-sided PICC line is seen with the catheter tip deep in the right atrium. This could be withdrawn 5-6 cm. No other focal abnormality is noted. IMPRESSION: PICC line deep within the right atrium. This could be withdrawn 5-6 cm. Electronically Signed   By: Alcide Clever M.D.   On: 11/05/2015 21:31   Ct Image Guided Drainage By Percutaneous Catheter  11/05/2015  INDICATION: 40 year old female with a history of multiple fluid collections within the abdomen. Largest confluent collection is within the pelvis. Trans gluteal drain is indicated. EXAM: CT GUIDED DRAINAGE OF pelvic ABSCESS MEDICATIONS: The patient is currently admitted to the hospital and receiving intravenous antibiotics. The antibiotics were administered within an appropriate time frame prior to the initiation of the procedure. ANESTHESIA/SEDATION: 2.0 mg IV Versed 100 mcg IV Fentanyl Moderate Sedation Time:  30 The patient was continuously monitored during the procedure by the interventional radiology nurse under my direct supervision. COMPLICATIONS: None TECHNIQUE: Informed written consent was obtained from the patient after a thorough discussion of the procedural risks, benefits and alternatives. All questions were addressed. Maximal Sterile Barrier Technique was utilized including caps, mask, sterile gowns, sterile gloves, sterile drape, hand hygiene and skin antiseptic. A timeout was performed prior to the initiation of the procedure. PROCEDURE: The operative field was prepped with chlorhexidine in a sterile fashion, and a sterile drape was applied covering the operative field. A sterile gown and sterile gloves were used for the procedure. Local anesthesia was provided with 1% Lidocaine. Patient positioned prone position on the CT scanner table and a scout CT of the pelvis was performed. Left trans gluteal access was selected. Once the  patient is prepped and draped in the usual sterile fashion, the skin and  subcutaneous tissues were generously infiltrated 1% lidocaine for local anesthesia. A small stab incision was made with 11 blade scalpel, and using CT guidance, trocar needle was advanced into the pelvic fluid collection. Using modified Seldinger technique, a 10 French pigtail catheter drain was placed. A sample of approximately 10 cc of complex fluid was aspirated for lab evaluation. Catheter was sutured in position and gravity drainage was attached. Sterile dressing placed. Patient tolerated the procedure well and remained hemodynamically stable throughout. No complications were encountered and no significant blood loss was encountered. FINDINGS: Scout CT demonstrates loculated fluid collection within the pelvis, recto uterine space. Images demonstrate safe placement of left trans gluteal pigtail catheter drain. IMPRESSION: Status post left trans gluteal pigtail catheter drain into the largest confluent fluid collection of the abdomen. Sample of fluid sent for analysis. Signed, Yvone Neu. Loreta Ave, DO [facility] Electronically Signed   By: Gilmer Mor D.O.   On: 11/05/2015 17:38    Anti-infectives: Anti-infectives    Start     Dose/Rate Route Frequency Ordered Stop   11/04/15 2215  ceFEPIme (MAXIPIME) 2 g in dextrose 5 % 50 mL IVPB     2 g 100 mL/hr over 30 Minutes Intravenous 3 times per day 11/04/15 2212     11/04/15 2212  metroNIDAZOLE (FLAGYL) IVPB 500 mg     500 mg 100 mL/hr over 60 Minutes Intravenous Every 8 hours 11/04/15 2212     11/04/15 2045  ciprofloxacin (CIPRO) IVPB 400 mg     400 mg 200 mL/hr over 60 Minutes Intravenous  Once 11/04/15 2034 11/04/15 2212   11/04/15 2045  metroNIDAZOLE (FLAGYL) IVPB 500 mg     500 mg 100 mL/hr over 60 Minutes Intravenous  Once 11/04/15 2034 11/04/15 2200      Assessment/Plan:  40 year old female with diverticulitis and associated abscess status post interventional radiology drainage.  Continues to clinically be improving dramatically. Discussed continuing to encourage ambulation and incentive from her usage. Plan for continuing IV antibiotics until pain is gone and leukocytosis is fully resolved prior to transition to oral antibiotics. Start patient on clear liquid diet today but will not advance until pain is fully gone.  Lashala Laser T. Tonita Cong, MD, FACS  11/06/2015

## 2015-11-06 NOTE — Progress Notes (Signed)
ANTICOAGULATION CONSULT NOTE - Initial Consult  Pharmacy Consult for Anticoagulation Post IR procedure Indication: VTE prophylaxis  No Known Allergies  Patient Measurements: Height:  (157.5 cm) Weight: 194 lb (87.998 kg) IBW/kg (Calculated) : 50.1   Vital Signs: Temp: 98.4 F (36.9 C) (02/25 0538) Temp Source: Oral (02/25 0538) BP: 111/66 mmHg (02/25 0538) Pulse Rate: 92 (02/25 0538)  Labs:  Recent Labs  11/04/15 1748 11/05/15 0631 11/06/15 0635  HGB 10.7* 10.1* 9.8*  HCT 32.4* 30.8* 29.9*  PLT 347 317 306  APTT  --  42*  --   LABPROT  --  14.9  --   INR  --  1.15  --   CREATININE 0.60 0.61 0.55    Estimated Creatinine Clearance: 96.4 mL/min (by C-G formula based on Cr of 0.55).   Medical History: Past Medical History  Diagnosis Date  . Hypertension     Medications:  Scheduled:  . ceFEPime (MAXIPIME) IV  2 g Intravenous 3 times per day   And  . metronidazole  500 mg Intravenous Q8H  . enoxaparin (LOVENOX) injection  40 mg Subcutaneous Q24H  . insulin aspart  0-20 Units Subcutaneous 6 times per day  . pantoprazole (PROTONIX) IV  40 mg Intravenous QHS   Infusions:  . dextrose 5 % and 0.45 % NaCl with KCl 20 mEq/L 1,000 mL (11/06/15 1108)    Assessment: 40 y/o F s/p IR procedure with diverticulitis and microperf.    Plan:  Patient is ordered Lovenox 40 mg daily starting today. Will sign off.   Luisa Hart D 11/06/2015,12:55 PM

## 2015-11-06 NOTE — Progress Notes (Signed)
Patient states that she is feeling much better today, ambulating to the bathroom and has urinated multiple times, transgluteal drain output 15 ml this shift. Dressing remains dry and intact. Patient prefers po pain med and given 1 pill about every 3 hours times doses. PICC line dressing clean dry and intact. IV infusing with difficulties. Antibiotics given as ordered.

## 2015-11-07 DIAGNOSIS — K651 Peritoneal abscess: Secondary | ICD-10-CM | POA: Insufficient documentation

## 2015-11-07 DIAGNOSIS — L0291 Cutaneous abscess, unspecified: Secondary | ICD-10-CM | POA: Insufficient documentation

## 2015-11-07 DIAGNOSIS — J189 Pneumonia, unspecified organism: Secondary | ICD-10-CM

## 2015-11-07 HISTORY — DX: Pneumonia, unspecified organism: J18.9

## 2015-11-07 LAB — BASIC METABOLIC PANEL
Anion gap: 7 (ref 5–15)
CHLORIDE: 104 mmol/L (ref 101–111)
CO2: 27 mmol/L (ref 22–32)
CREATININE: 0.63 mg/dL (ref 0.44–1.00)
Calcium: 8 mg/dL — ABNORMAL LOW (ref 8.9–10.3)
GFR calc Af Amer: >60 mL/min (ref >60)
GFR calc non Af Amer: >60 mL/min (ref >60)
GLUCOSE: 131 mg/dL — AB (ref 65–99)
Potassium: 3.7 mmol/L (ref 3.5–5.1)
SODIUM: 138 mmol/L (ref 135–145)

## 2015-11-07 LAB — GLUCOSE, CAPILLARY
GLUCOSE-CAPILLARY: 132 mg/dL — AB (ref 65–99)
GLUCOSE-CAPILLARY: 107 mg/dL — AB (ref 65–99)
Glucose-Capillary: 106 mg/dL — ABNORMAL HIGH (ref 65–99)
Glucose-Capillary: 118 mg/dL — ABNORMAL HIGH (ref 65–99)
Glucose-Capillary: 124 mg/dL — ABNORMAL HIGH (ref 65–99)
Glucose-Capillary: 132 mg/dL — ABNORMAL HIGH (ref 65–99)

## 2015-11-07 LAB — CBC
HCT: 29.1 % — ABNORMAL LOW (ref 35.0–47.0)
HEMOGLOBIN: 9.6 g/dL — AB (ref 12.0–16.0)
MCH: 28.2 pg (ref 26.0–34.0)
MCHC: 33.1 g/dL (ref 32.0–36.0)
MCV: 85.3 fL (ref 80.0–100.0)
Platelets: 335 10*3/uL (ref 150–440)
RBC: 3.42 MIL/uL — ABNORMAL LOW (ref 3.80–5.20)
RDW: 16.5 % — ABNORMAL HIGH (ref 11.5–14.5)
WBC: 11.7 10*3/uL — ABNORMAL HIGH (ref 3.6–11.0)

## 2015-11-07 LAB — PHOSPHORUS: Phosphorus: 2.6 mg/dL (ref 2.5–4.6)

## 2015-11-07 LAB — MAGNESIUM: Magnesium: 2.3 mg/dL (ref 1.7–2.4)

## 2015-11-07 MED ORDER — SODIUM CHLORIDE 0.9 % IV SOLN
1.0000 g | INTRAVENOUS | Status: DC
  Administered 2015-11-07 – 2015-11-11 (×5): 1 g via INTRAVENOUS
  Filled 2015-11-07 (×5): qty 1

## 2015-11-07 MED ORDER — PIPERACILLIN-TAZOBACTAM 3.375 G IVPB 30 MIN: 3.3750 g | Freq: Four times a day (QID) | INTRAVENOUS | Status: DC

## 2015-11-07 MED ORDER — PIPERACILLIN-TAZOBACTAM 3.375 G IVPB
3.3750 g | Freq: Three times a day (TID) | INTRAVENOUS | Status: DC
  Filled 2015-11-07 (×2): qty 50

## 2015-11-07 MED ORDER — INSULIN ASPART 100 UNIT/ML ~~LOC~~ SOLN
0.0000 [IU] | Freq: Three times a day (TID) | SUBCUTANEOUS | Status: DC
  Administered 2015-11-08 – 2015-11-10 (×2): 3 [IU] via SUBCUTANEOUS
  Filled 2015-11-07 (×2): qty 3

## 2015-11-07 NOTE — Progress Notes (Signed)
Pharmacy Antibiotic Note  Maria Mcbride is a 40 y.o. female admitted on 11/04/2015 with Diverticulitis and intra-abdominal infection.  Pharmacy has been consulted for Ertapenem dosing.  Culture (abscess): ESBL positive   Plan: Culture results from abscess ESBL positive. Spoke with Dr. Tonita Cong about starting ertapenem for ESBL coverage.  Will start patient on Ertapenem 1gm IV daily.   Height:  (157.5 cm) Weight: 198 lb 1.6 oz (89.858 kg) IBW/kg (Calculated) : 50.1  Temp (24hrs), Avg:98.5 F (36.9 C), Min:98.3 F (36.8 C), Max:98.7 F (37.1 C)   Recent Labs Lab 11/04/15 1748 11/05/15 0631 11/06/15 0635 11/07/15 0544  WBC 16.0* 14.4* 13.5* 11.7*  CREATININE 0.60 0.61 0.55 0.63    Estimated Creatinine Clearance: 97.4 mL/min (by C-G formula based on Cr of 0.63).    No Known Allergies  Antimicrobials this admission: 02/23-02/26 >> cefepime 02/23- 02/26 >> metronidazole 02/26 >> Ertapenem    Microbiology results: 2/24 Abscess Cx: ESBL positive, moderate growth E.Coli  Thank you for allowing pharmacy to be a part of this patient's care.  Cher Nakai, PharmD Pharmacy Resident  11/07/2015 11:57 AM

## 2015-11-07 NOTE — Progress Notes (Signed)
CC: Diverticular abscess Subjective: Patient reports she continues to improve. She only notices abdominal pain after drinking liquids. She denies any fevers, chills, nausea, vomiting. She is much improved from admission.  Objective: Vital signs in last 24 hours: Temp:  [98.3 F (36.8 C)-98.7 F (37.1 C)] 98.7 F (37.1 C) (02/26 0450) Pulse Rate:  [79-92] 79 (02/26 0450) Resp:  [16-18] 16 (02/26 0450) BP: (107-115)/(61-75) 112/75 mmHg (02/26 0450) SpO2:  [93 %-99 %] 93 % (02/26 0450) Weight:  [89.858 kg (198 lb 1.6 oz)] 89.858 kg (198 lb 1.6 oz) (02/26 0320) Last BM Date: 11/06/15  Intake/Output from previous day: 02/25 0701 - 02/26 0700 In: 2381 [I.V.:2231; IV Piggyback:150] Out: 1165 [Urine:1150; Drains:15] Intake/Output this shift: Total I/O In: 433.3 [I.V.:333.3; IV Piggyback:100] Out: 350 [Urine:350]  Physical exam:  Gen.: No acute distress  chest: Clear to auscultation Heart: Regular rate and rhythm Abdomen: Soft, tender to palpation in the lower abdomen, nondistended.  Lab Results: CBC   Recent Labs  11/06/15 0635 11/07/15 0544  WBC 13.5* 11.7*  HGB 9.8* 9.6*  HCT 29.9* 29.1*  PLT 306 335   BMET  Recent Labs  11/06/15 0635 11/07/15 0544  NA 132* 138  K 3.5 3.7  CL 99* 104  CO2 27 27  GLUCOSE 120* 131*  BUN 6 <5*  CREATININE 0.55 0.63  CALCIUM 7.3* 8.0*   PT/INR  Recent Labs  11/05/15 0631  LABPROT 14.9  INR 1.15   ABG No results for input(s): PHART, HCO3 in the last 72 hours.  Invalid input(s): PCO2, PO2  Studies/Results: Dg Chest Port 1 View  11/05/2015  CLINICAL DATA:  PICC line adjustment EXAM: PORTABLE CHEST 1 VIEW COMPARISON:  11/05/2015 FINDINGS: Left PICC line has been retracted with the tip now in the lower SVC near the cavoatrial junction. There is right base atelectasis or infiltrate. Left lung is clear. Heart is normal size. No effusions or acute bony abnormality. IMPRESSION: Left PICC line tip in the lower SVC near the  cavoatrial junction. Right base atelectasis or infiltrate. Electronically Signed   By: Charlett Nose M.D.   On: 11/05/2015 21:47   Dg Chest Port 1 View  11/05/2015  CLINICAL DATA:  Status post PICC line placement EXAM: PORTABLE CHEST 1 VIEW COMPARISON:  10/30/2015 FINDINGS: Cardiac shadow is within normal limits. The lungs are clear. A new left-sided PICC line is seen with the catheter tip deep in the right atrium. This could be withdrawn 5-6 cm. No other focal abnormality is noted. IMPRESSION: PICC line deep within the right atrium. This could be withdrawn 5-6 cm. Electronically Signed   By: Alcide Clever M.D.   On: 11/05/2015 21:31   Ct Image Guided Drainage By Percutaneous Catheter  11/05/2015  INDICATION: 40 year old female with a history of multiple fluid collections within the abdomen. Largest confluent collection is within the pelvis. Trans gluteal drain is indicated. EXAM: CT GUIDED DRAINAGE OF pelvic ABSCESS MEDICATIONS: The patient is currently admitted to the hospital and receiving intravenous antibiotics. The antibiotics were administered within an appropriate time frame prior to the initiation of the procedure. ANESTHESIA/SEDATION: 2.0 mg IV Versed 100 mcg IV Fentanyl Moderate Sedation Time:  30 The patient was continuously monitored during the procedure by the interventional radiology nurse under my direct supervision. COMPLICATIONS: None TECHNIQUE: Informed written consent was obtained from the patient after a thorough discussion of the procedural risks, benefits and alternatives. All questions were addressed. Maximal Sterile Barrier Technique was utilized including caps, mask, sterile gowns, sterile  gloves, sterile drape, hand hygiene and skin antiseptic. A timeout was performed prior to the initiation of the procedure. PROCEDURE: The operative field was prepped with chlorhexidine in a sterile fashion, and a sterile drape was applied covering the operative field. A sterile gown and sterile gloves  were used for the procedure. Local anesthesia was provided with 1% Lidocaine. Patient positioned prone position on the CT scanner table and a scout CT of the pelvis was performed. Left trans gluteal access was selected. Once the patient is prepped and draped in the usual sterile fashion, the skin and subcutaneous tissues were generously infiltrated 1% lidocaine for local anesthesia. A small stab incision was made with 11 blade scalpel, and using CT guidance, trocar needle was advanced into the pelvic fluid collection. Using modified Seldinger technique, a 10 French pigtail catheter drain was placed. A sample of approximately 10 cc of complex fluid was aspirated for lab evaluation. Catheter was sutured in position and gravity drainage was attached. Sterile dressing placed. Patient tolerated the procedure well and remained hemodynamically stable throughout. No complications were encountered and no significant blood loss was encountered. FINDINGS: Scout CT demonstrates loculated fluid collection within the pelvis, recto uterine space. Images demonstrate safe placement of left trans gluteal pigtail catheter drain. IMPRESSION: Status post left trans gluteal pigtail catheter drain into the largest confluent fluid collection of the abdomen. Sample of fluid sent for analysis. Signed, Yvone Neu. Loreta Ave, DO [facility] Radiology Electronically Signed   By: Gilmer Mor D.O.   On: 11/05/2015 17:38    Anti-infectives: Anti-infectives    Start     Dose/Rate Route Frequency Ordered Stop   11/04/15 2215  ceFEPIme (MAXIPIME) 2 g in dextrose 5 % 50 mL IVPB     2 g 100 mL/hr over 30 Minutes Intravenous 3 times per day 11/04/15 2212     11/04/15 2212  metroNIDAZOLE (FLAGYL) IVPB 500 mg     500 mg 100 mL/hr over 60 Minutes Intravenous Every 8 hours 11/04/15 2212     11/04/15 2045  ciprofloxacin (CIPRO) IVPB 400 mg     400 mg 200 mL/hr over 60 Minutes Intravenous  Once  11/04/15 2034 11/04/15 2212   11/04/15 2045  metroNIDAZOLE (FLAGYL) IVPB 500 mg     500 mg 100 mL/hr over 60 Minutes Intravenous  Once 11/04/15 2034 11/04/15 2200      Assessment/Plan:  40 year old female admitted with a diverticular abscess in her pelvis. She is status post IR drainage. She continues to improve both clinically and via laboratory evaluation. Again reiterated to the patient that we would continue IV antibiotics until her labs are normal and her pain was resolved. Patient voiced understanding. Although she is written for her clear liquid diet discussed that she has not required to drink at all until her pain is resolved. Continue IV antibiotics, IV fluids, DVT prophylaxis. Encourage ambulation and incentive spirometer usage. IR placed drain will remain until output is 0 for 24 hours.  Zyria Fiscus T. Tonita Cong, MD, FACS  11/07/2015

## 2015-11-07 NOTE — Progress Notes (Signed)
Per lab abscess culture is positive for ESBL.  This was reported to Dr. Tonita Cong at this time.  Will place patient on contact isolation.  No further orders given.

## 2015-11-07 NOTE — Progress Notes (Signed)
Pharmacy Antibiotic Note  Maria Mcbride is a 40 y.o. female admitted on 11/04/2015 with IAI.  Pharmacy has been consulted for Zosyn dosing. Patient with abscess culture growing ESBL E coli.   Plan: Zosyn 3.375g IV q8h (4 hour infusion).  Height:  (157.5 cm) Weight: 198 lb 1.6 oz (89.858 kg) IBW/kg (Calculated) : 50.1  Temp (24hrs), Avg:98.5 F (36.9 C), Min:98.3 F (36.8 C), Max:98.7 F (37.1 C)   Recent Labs Lab 11/04/15 1748 11/05/15 0631 11/06/15 0635 11/07/15 0544  WBC 16.0* 14.4* 13.5* 11.7*  CREATININE 0.60 0.61 0.55 0.63    Estimated Creatinine Clearance: 97.4 mL/min (by C-G formula based on Cr of 0.63).    No Known Allergies  Antimicrobials this admission: Cefepime and metronidazole 02/23 >> 02/26 Cipro 02/23 >> 02/23 Zosyn 02/26 >>  Dose adjustments this admission:   Microbiology results: 02/24 Abscess: E coli  Thank you for allowing pharmacy to be a part of this patient's care.  Luisa Hart D 11/07/2015 11:16 AM

## 2015-11-07 NOTE — Progress Notes (Signed)
Called with results of culture ESBL E Coli Resistant to current ABX. Will change to Zosyn and continue to monitor.  Ricarda Frame, MD FACS General Surgeon [facility]

## 2015-11-08 LAB — MAGNESIUM: Magnesium: 2.3 mg/dL (ref 1.7–2.4)

## 2015-11-08 LAB — CBC
HEMATOCRIT: 30.8 % — AB (ref 35.0–47.0)
HEMOGLOBIN: 10.5 g/dL — AB (ref 12.0–16.0)
MCH: 29.5 pg (ref 26.0–34.0)
MCHC: 34 g/dL (ref 32.0–36.0)
MCV: 86.7 fL (ref 80.0–100.0)
Platelets: 359 10*3/uL (ref 150–440)
RBC: 3.55 MIL/uL — ABNORMAL LOW (ref 3.80–5.20)
RDW: 16.3 % — ABNORMAL HIGH (ref 11.5–14.5)
WBC: 13.3 10*3/uL — ABNORMAL HIGH (ref 3.6–11.0)

## 2015-11-08 LAB — GLUCOSE, CAPILLARY
GLUCOSE-CAPILLARY: 135 mg/dL — AB (ref 65–99)
GLUCOSE-CAPILLARY: 90 mg/dL (ref 65–99)
Glucose-Capillary: 114 mg/dL — ABNORMAL HIGH (ref 65–99)
Glucose-Capillary: 87 mg/dL (ref 65–99)

## 2015-11-08 LAB — BASIC METABOLIC PANEL
Anion gap: 6 (ref 5–15)
CHLORIDE: 102 mmol/L (ref 101–111)
CO2: 28 mmol/L (ref 22–32)
Calcium: 8 mg/dL — ABNORMAL LOW (ref 8.9–10.3)
Creatinine, Ser: 0.64 mg/dL (ref 0.44–1.00)
GFR calc Af Amer: >60 mL/min (ref >60)
GFR calc non Af Amer: >60 mL/min (ref >60)
Glucose, Bld: 123 mg/dL — ABNORMAL HIGH (ref 65–99)
POTASSIUM: 4.3 mmol/L (ref 3.5–5.1)
SODIUM: 136 mmol/L (ref 135–145)

## 2015-11-08 LAB — PHOSPHORUS: PHOSPHORUS: 3 mg/dL (ref 2.5–4.6)

## 2015-11-08 NOTE — Progress Notes (Signed)
CC: Diverticular abscess with microperforation status post percutaneous drainage Subjective: Feeling better. She does have experience some intermittent abdominal pain worsening with meals. WBC hovering at 13 No vomiting, afebrile Drain 10 cc  Objective: Vital signs in last 24 hours: Temp:  [97.9 F (36.6 C)-98.4 F (36.9 C)] 97.9 F (36.6 C) (02/27 1435) Pulse Rate:  [87-89] 89 (02/27 1435) Resp:  [16-18] 16 (02/27 1435) BP: (119-123)/(79-84) 123/79 mmHg (02/27 1435) SpO2:  [98 %-99 %] 99 % (02/27 1435) Last BM Date: 11/06/15  Intake/Output from previous day: 02/26 0701 - 02/27 0700 In: 2671.8 [I.V.:2521.8; IV Piggyback:150] Out: 3610 [Urine:3600; Drains:10] Intake/Output this shift: Total I/O In: 379.2 [I.V.:379.2] Out: 1100 [Urine:1100]  Physical exam: NO Acute distress, awake alert Chest: Clear to auscultation, S1 and S2 no murmurs Abdomen: Soft minimal tenderness to palpation in left lower quadrant. No peritonitis. Drain with some minimal output Extremities: Well perfused no edema Neurological: Awake alert no motor deficits  Lab Results: CBC   Recent Labs  11/07/15 0544 11/08/15 0558  WBC 11.7* 13.3*  HGB 9.6* 10.5*  HCT 29.1* 30.8*  PLT 335 359   BMET  Recent Labs  11/07/15 0544 11/08/15 0558  NA 138 136  K 3.7 4.3  CL 104 102  CO2 27 28  GLUCOSE 131* 123*  BUN <5* <5*  CREATININE 0.63 0.64  CALCIUM 8.0* 8.0*   PT/INR No results for input(s): LABPROT, INR in the last 72 hours. ABG No results for input(s): PHART, HCO3 in the last 72 hours.  Invalid input(s): PCO2, PO2  Studies/Results: No results found.  Anti-infectives: Anti-infectives    Start     Dose/Rate Route Frequency Ordered Stop   11/07/15 1400  piperacillin-tazobactam (ZOSYN) IVPB 3.375 g  Status:  Discontinued     3.375 g 12.5 mL/hr over 240 Minutes Intravenous 3 times per day 11/07/15 1115 11/07/15 1151   11/07/15 1200  piperacillin-tazobactam (ZOSYN) IVPB 3.375 g  Status:   Discontinued     3.375 g 100 mL/hr over 30 Minutes Intravenous 4 times per day 11/07/15 1059 11/07/15 1114   11/07/15 1200  ertapenem (INVANZ) 1 g in sodium chloride 0.9 % 50 mL IVPB     1 g 100 mL/hr over 30 Minutes Intravenous Every 24 hours 11/07/15 1154     11/04/15 2215  ceFEPIme (MAXIPIME) 2 g in dextrose 5 % 50 mL IVPB  Status:  Discontinued     2 g 100 mL/hr over 30 Minutes Intravenous 3 times per day 11/04/15 2212 11/07/15 1059   11/04/15 2212  metroNIDAZOLE (FLAGYL) IVPB 500 mg  Status:  Discontinued     500 mg 100 mL/hr over 60 Minutes Intravenous Every 8 hours 11/04/15 2212 11/07/15 1059   11/04/15 2045  ciprofloxacin (CIPRO) IVPB 400 mg     400 mg 200 mL/hr over 60 Minutes Intravenous  Once 11/04/15 2034 11/04/15 2212   11/04/15 2045  metroNIDAZOLE (FLAGYL) IVPB 500 mg     500 mg 100 mL/hr over 60 Minutes Intravenous  Once 11/04/15 2034 11/04/15 2200      Assessment/Plan:  sLowly improving ESBL positive  On Ertapenem, continue current antibiotic therapy No surgical intervention If white count persists elevated may need CT scan of the abdomen and pelvis tomorrow. The might be some undrained collections Discussed with the patient in detail and extensive counseling provided to both the husband and the patient   Sterling Big, MD, [facility]  11/08/2015

## 2015-11-08 NOTE — Progress Notes (Signed)
Initial Nutrition Assessment      INTERVENTION:  Meals and snacks: Await diet progression Medical Nutrition Supplement Therapy: Pt would benefit from boost breeze (clear liquid supplement) TID or ensure enlive BID for added nutrition once diet progressed.   NUTRITION DIAGNOSIS:   Inadequate oral intake related to altered GI function as evidenced by energy intake < or equal to 50% for > or equal to 5 days.    GOAL:   Patient will meet greater than or equal to 90% of their needs    MONITOR:    (Energy intake, Digestive system)  REASON FOR ASSESSMENT:   NPO/Clear Liquid Diet    ASSESSMENT:      Pt admitted with diverticulitis and intraabdominal abscess s/p right drain  Past Medical History  Diagnosis Date  . Hypertension     Current Nutrition: taking ice and ate popscile this am  Food/Nutrition-Related History: pt reports decreased intake for the past 2 1/2 weeks, not eating hardly anything secondary to abdominal pain   Scheduled Medications:  . enoxaparin (LOVENOX) injection  40 mg Subcutaneous Q24H  . ertapenem  1 g Intravenous Q24H  . insulin aspart  0-20 Units Subcutaneous TID AC & HS  . pantoprazole (PROTONIX) IV  40 mg Intravenous QHS    Continuous Medications:  . dextrose 5 % and 0.45 % NaCl with KCl 20 mEq/L 125 mL/hr at 11/07/15 2342     Electrolyte/Renal Profile and Glucose Profile:   Recent Labs Lab 11/05/15 0631 11/06/15 0635 11/07/15 0544 11/08/15 0558  NA 134* 132* 138 136  K 2.7* 3.5 3.7 4.3  CL 93* 99* 104 102  CO2 BUN <5* 6 <5* <5*  CREATININE 0.61 0.55 0.63 0.64  CALCIUM 7.8* 7.3* 8.0* 8.0*  MG 2.2  --  2.3 2.3  PHOS 3.3  --  2.6 3.0  GLUCOSE 121* 120* 131* 123*   Protein Profile:  Recent Labs Lab 11/04/15 1748 11/05/15 0631  ALBUMIN 3.2* 2.9*    Gastrointestinal Profile: Last BM:2/25   Nutrition-Focused Physical Exam Findings: Nutrition-Focused physical exam completed. Findings are WDL for fat  depletion, muscle depletion, and edema.     Weight Change: 3% wt loss in the last month per wt encounters    Diet Order:  Diet clear liquid Room service appropriate?: Yes; Fluid consistency:: Thin  Skin:   reviewed   Height:   Ht Readings from Last 1 Encounters:  11/04/15  (1.575 m)    Weight:   Wt Readings from Last 1 Encounters:  11/07/15 198 lb 1.6 oz (89.858 kg)    Ideal Body Weight:     BMI:  Body mass index is 36.22 kg/(m^2).  Estimated Nutritional Needs:   Kcal:  BEE 1123 kcals (IF 1.1-1.3, AF 1.3)1605-1897 kcals/d. Using IBW of 50kg  Protein:  (1.0-1.2 gkg) 50-60 g/d  Fluid:  (30-35ml/kg) 1500-1763ml/d  EDUCATION NEEDS:   No education needs identified at this time  MODERATE Care Level  Jd Mccaster B. Freida Busman, RD, LDN [phone] (pager) Weekend/On-Call pager [phone])

## 2015-11-09 ENCOUNTER — Encounter: Payer: Self-pay | Admitting: Radiology

## 2015-11-09 ENCOUNTER — Ambulatory Visit: Payer: BLUE CROSS/BLUE SHIELD | Admitting: Internal Medicine

## 2015-11-09 ENCOUNTER — Inpatient Hospital Stay: Payer: BLUE CROSS/BLUE SHIELD

## 2015-11-09 LAB — GLUCOSE, CAPILLARY
GLUCOSE-CAPILLARY: 118 mg/dL — AB (ref 65–99)
GLUCOSE-CAPILLARY: 110 mg/dL — AB (ref 65–99)
GLUCOSE-CAPILLARY: 109 mg/dL — AB (ref 65–99)
GLUCOSE-CAPILLARY: 114 mg/dL — AB (ref 65–99)
Glucose-Capillary: 106 mg/dL — ABNORMAL HIGH (ref 65–99)

## 2015-11-09 LAB — BASIC METABOLIC PANEL
Anion gap: 8 (ref 5–15)
CALCIUM: 8.3 mg/dL — AB (ref 8.9–10.3)
CHLORIDE: 101 mmol/L (ref 101–111)
CO2: 26 mmol/L (ref 22–32)
CREATININE: 0.69 mg/dL (ref 0.44–1.00)
GFR calc Af Amer: >60 mL/min (ref >60)
GFR calc non Af Amer: >60 mL/min (ref >60)
GLUCOSE: 115 mg/dL — AB (ref 65–99)
Potassium: 4.1 mmol/L (ref 3.5–5.1)
Sodium: 135 mmol/L (ref 135–145)

## 2015-11-09 LAB — CBC
HEMATOCRIT: 32.5 % — AB (ref 35.0–47.0)
Hemoglobin: 10.6 g/dL — ABNORMAL LOW (ref 12.0–16.0)
MCH: 28.2 pg (ref 26.0–34.0)
MCHC: 32.6 g/dL (ref 32.0–36.0)
MCV: 86.5 fL (ref 80.0–100.0)
Platelets: 385 10*3/uL (ref 150–440)
RBC: 3.75 MIL/uL — AB (ref 3.80–5.20)
RDW: 16.6 % — AB (ref 11.5–14.5)
WBC: 13.7 10*3/uL — AB (ref 3.6–11.0)

## 2015-11-09 LAB — MRSA PCR SCREENING: MRSA BY PCR: NEGATIVE

## 2015-11-09 MED ORDER — FENTANYL CITRATE (PF) 100 MCG/2ML IJ SOLN
INTRAMUSCULAR | Status: AC
  Filled 2015-11-09: qty 2

## 2015-11-09 MED ORDER — IOHEXOL 300 MG/ML  SOLN
100.0000 mL | Freq: Once | INTRAMUSCULAR | Status: AC | PRN
  Administered 2015-11-09: 100 mL via INTRAVENOUS

## 2015-11-09 MED ORDER — MIDAZOLAM HCL 5 MG/5ML IJ SOLN
INTRAMUSCULAR | Status: AC
  Filled 2015-11-09: qty 5

## 2015-11-09 MED ORDER — VANCOMYCIN HCL IN DEXTROSE 1-5 GM/200ML-% IV SOLN
1000.0000 mg | Freq: Three times a day (TID) | INTRAVENOUS | Status: DC
  Administered 2015-11-09 – 2015-11-10 (×2): 1000 mg via INTRAVENOUS
  Filled 2015-11-09 (×4): qty 200

## 2015-11-09 MED ORDER — IOHEXOL 240 MG/ML SOLN
25.0000 mL | INTRAMUSCULAR | Status: AC
  Administered 2015-11-09 (×2): 25 mL via ORAL

## 2015-11-09 MED ORDER — VANCOMYCIN HCL IN DEXTROSE 1-5 GM/200ML-% IV SOLN
1000.0000 mg | Freq: Once | INTRAVENOUS | Status: AC
  Administered 2015-11-09: 1000 mg via INTRAVENOUS
  Filled 2015-11-09: qty 200

## 2015-11-09 NOTE — Sedation Documentation (Signed)
No sedation.  Tube repositioning only.

## 2015-11-09 NOTE — Progress Notes (Signed)
Pharmacy Antibiotic Note  Maria Mcbride is a 40 y.o. female admitted on 11/04/2015 with diverticular abscess. Now found to have right sided pleuritic pain and being treated for pneumonia.  Pharmacy has been consulted for vancomcyin dosing.  Plan: Ke: 0.098   T1/2: 7.1   Vd: 47 Dosing weight: 66 kg   Patient received vancomcyin 1g x1 dose at 1430. Will start patient on Vancomycin  every 8 hours with 6 hours stack dosing. Calculated trough at Css 18.7.  Spoke with Dr. Everlene Farrier who authorized MRSA PCR screen. Will follow up on results and recommend discontinuation if MRSA PCR is negative.   Height:  (157.5 cm) Weight: 198 lb 1.6 oz (89.858 kg) IBW/kg (Calculated) : 50.1  Temp (24hrs), Avg:98 F (36.7 C), Min:97.8 F (36.6 C), Max:98.2 F (36.8 C)   Recent Labs Lab 11/05/15 0631 11/06/15 0635 11/07/15 0544 11/08/15 0558 11/09/15 0508  WBC 14.4* 13.5* 11.7* 13.3* 13.7*  CREATININE 0.61 0.55 0.63 0.64 0.69    Estimated Creatinine Clearance: 97.4 mL/min (by C-G formula based on Cr of 0.69).    No Known Allergies  Antimicrobials this admission: 02/23- 2/26>> cefepime  02/23-02/23 >> ciprofloxacin  02/23-02/26 >> Metronidazole 02/26- >> Ertapenem   02/28- >> Vancomycin   Dose adjustments this admission:   Microbiology results: 2/28 BCx: pending  2/24 Wound Cx: ESBL E.Coli   2/28 MRSA PCR: pending   Thank you for allowing pharmacy to be a part of this patient's care.  Iliya Spivack M Andric Kerce 11/09/2015 2:24 PM

## 2015-11-09 NOTE — Progress Notes (Signed)
CC: Diverticular abscess Subjective: Patient having some right-sided pleuritic pain. Minimal by mouth intake. Some intermittent abdominal pain. Condition has remained same. White count has increased  Objective: Vital signs in last 24 hours: Temp:  [97.8 F (36.6 C)-98.1 F (36.7 C)] 97.8 F (36.6 C) (02/28 0449) Pulse Rate:  [85-89] 85 (02/28 0449) Resp:  [16-20] 20 (02/28 0449) BP: (115-132)/(71-79) 115/71 mmHg (02/28 0449) SpO2:  [96 %-99 %] 96 % (02/28 0449) Last BM Date: 11/09/15  Intake/Output from previous day: 02/27 0701 - 02/28 0700 In: 1573.2 [I.V.:1573.2] Out: 2450 [Urine:2450] Intake/Output this shift: Total I/O In: 531.7 [I.V.:531.7] Out: -   Physical exam: No acute distress awake alert  Chest: Clear", S1 and S2, no murmurs  Abdomen: Soft, mildly tender to palpation left lower quadrant, no peritonitis. Drain with minimal output. Extremities: No edema well perfused  Lab Results: CBC   Recent Labs  11/08/15 0558 11/09/15 0508  WBC 13.3* 13.7*  HGB 10.5* 10.6*  HCT 30.8* 32.5*  PLT 359 385   BMET  Recent Labs  11/08/15 0558 11/09/15 0508  NA 136 135  K 4.3 4.1  CL 102 101  CO2 28 26  GLUCOSE 123* 115*  BUN <5* <5*  CREATININE 0.64 0.69  CALCIUM 8.0* 8.3*   PT/INR No results for input(s): LABPROT, INR in the last 72 hours. ABG No results for input(s): PHART, HCO3 in the last 72 hours.  Invalid input(s): PCO2, PO2  Studies/Results: No results found.  Anti-infectives: Anti-infectives    Start     Dose/Rate Route Frequency Ordered Stop   11/07/15 1400  piperacillin-tazobactam (ZOSYN) IVPB 3.375 g  Status:  Discontinued     3.375 g 12.5 mL/hr over 240 Minutes Intravenous 3 times per day 11/07/15 1115 11/07/15 1151   11/07/15 1200  piperacillin-tazobactam (ZOSYN) IVPB 3.375 g  Status:  Discontinued     3.375 g 100 mL/hr over 30 Minutes Intravenous 4 times per day 11/07/15 1059 11/07/15 1114   11/07/15 1200  ertapenem (INVANZ) 1 g in  sodium chloride 0.9 % 50 mL IVPB     1 g 100 mL/hr over 30 Minutes Intravenous Every 24 hours 11/07/15 1154     11/04/15 2215  ceFEPIme (MAXIPIME) 2 g in dextrose 5 % 50 mL IVPB  Status:  Discontinued     2 g 100 mL/hr over 30 Minutes Intravenous 3 times per day 11/04/15 2212 11/07/15 1059   11/04/15 2212  metroNIDAZOLE (FLAGYL) IVPB 500 mg  Status:  Discontinued     500 mg 100 mL/hr over 60 Minutes Intravenous Every 8 hours 11/04/15 2212 11/07/15 1059   11/04/15 2045  ciprofloxacin (CIPRO) IVPB 400 mg     400 mg 200 mL/hr over 60 Minutes Intravenous  Once 11/04/15 2034 11/04/15 2212   11/04/15 2045  metroNIDAZOLE (FLAGYL) IVPB 500 mg     500 mg 100 mL/hr over 60 Minutes Intravenous  Once 11/04/15 2034 11/04/15 2200      Assessment/Plan: Diverticular abscess now her condition has staggered given the increase in the white count I might be concerned that the drain is not evacuating the collection appropriately ordered at the other collections have increasing size. Plan is to repeat the CT scan of the abdomen and pelvis to evaluate for ongoing diverticulitis. She may need another drain placed and if nonsurgical therapy fails we may have to do a Hartman's procedure. Discussed with the patient in detail and she understands. Continue current aggressive antibiotics, no need for immediate surgical intervention Diego  Everlene Farrier, MD, [facility]  11/09/2015

## 2015-11-09 NOTE — Progress Notes (Signed)
Pt complained.  of itching and being red on face, chest, and back; VSS; IV benadryl given. Dr. Excell Seltzer notified. Dr. Excell Seltzer explained that it was a red man syndrome.

## 2015-11-10 DIAGNOSIS — K5720 Diverticulitis of large intestine with perforation and abscess without bleeding: Secondary | ICD-10-CM | POA: Insufficient documentation

## 2015-11-10 LAB — CBC
HCT: 31.3 % — ABNORMAL LOW (ref 35.0–47.0)
HEMOGLOBIN: 10.5 g/dL — AB (ref 12.0–16.0)
MCH: 28.4 pg (ref 26.0–34.0)
MCHC: 33.6 g/dL (ref 32.0–36.0)
MCV: 84.5 fL (ref 80.0–100.0)
Platelets: 408 10*3/uL (ref 150–440)
RBC: 3.7 MIL/uL — ABNORMAL LOW (ref 3.80–5.20)
RDW: 16.5 % — ABNORMAL HIGH (ref 11.5–14.5)
WBC: 10.1 10*3/uL (ref 3.6–11.0)

## 2015-11-10 LAB — GLUCOSE, CAPILLARY
GLUCOSE-CAPILLARY: 122 mg/dL — AB (ref 65–99)
Glucose-Capillary: 105 mg/dL — ABNORMAL HIGH (ref 65–99)
Glucose-Capillary: 99 mg/dL (ref 65–99)
Glucose-Capillary: 103 mg/dL — ABNORMAL HIGH (ref 65–99)

## 2015-11-10 NOTE — Progress Notes (Signed)
CC: Diverticular abscess Subjective: Had drain repositioned by interventional radiology with 30 cc of possible coming out. Patient this morning feels much better and the best she is felt in about 10 days.  She is tolerating liquids, no nausea or vomiting white count is going down  Objective: Vital signs in last 24 hours: Temp:  [97.4 F (36.3 C)-98.5 F (36.9 C)] 97.4 F (36.3 C) (03/01 0417) Pulse Rate:  [80-94] 88 (03/01 0417) Resp:  [17-20] 20 (03/01 0417) BP: (104-127)/(60-76) 124/73 mmHg (03/01 0417) SpO2:  [95 %-100 %] 98 % (03/01 0417) Last BM Date: 11/10/15  Intake/Output from previous day: 02/28 0701 - 03/01 0700 In: 3148.5 [P.O.:120; I.V.:2828.5; IV Piggyback:200] Out: 2915 [Urine:2900; Drains:15] Intake/Output this shift:    Physical exam: No acute distress awake alert Chest: Clear to rotation bilaterally. S1 and S2 no murmurs. Abdomen: Soft nontender no peritonitis. Drain with minimal output. Extremities: No edema, well-perfused Neuro; awake alert, no motor or sensory deficits  Lab Results: CBC   Recent Labs  11/09/15 0508 11/10/15 0626  WBC 13.7* 10.1  HGB 10.6* 10.5*  HCT 32.5* 31.3*  PLT 385 408   BMET  Recent Labs  11/08/15 0558 11/09/15 0508  NA 136 135  K 4.3 4.1  CL 102 101  CO2 28 26  GLUCOSE 123* 115*  BUN <5* <5*  CREATININE 0.64 0.69  CALCIUM 8.0* 8.3*   PT/INR No results for input(s): LABPROT, INR in the last 72 hours. ABG No results for input(s): PHART, HCO3 in the last 72 hours.  Invalid input(s): PCO2, PO2  Studies/Results: Dg Chest 1 View  11/09/2015  CLINICAL DATA:  Right-sided pleuritic pain. EXAM: CHEST 1 VIEW COMPARISON:  November 05, 2015. FINDINGS: The heart size and mediastinal contours are within normal limits. No pneumothorax or pleural effusion is noted. Linear opacity is noted in the lingular region of the left lung concerning for subsegmental atelectasis. Mild right basilar subsegmental atelectasis is noted as  well. The visualized skeletal structures are unremarkable. Left-sided PICC line is again noted, with distal tip coiled within the top of the SVC. IMPRESSION: Bilateral subsegmental atelectasis is noted. Left-sided PICC line tip is noted coiled within the top of the SVC. Electronically Signed   By: Lupita Raider, M.D.   On: 11/09/2015 14:19   Ct Abdomen Pelvis W Contrast  11/09/2015  CLINICAL DATA:  Right lower quadrant pain for 2 weeks. Status post placement of a drainage catheter in a pelvic abscess 11/05/2015. The abscess developed secondary to diverticulitis. Subsequent encounter. EXAM: CT ABDOMEN AND PELVIS WITH CONTRAST TECHNIQUE: Multidetector CT imaging of the abdomen and pelvis was performed using the standard protocol following bolus administration of intravenous contrast. CONTRAST:  100 ml OMNIPAQUE IOHEXOL 300 MG/ML  SOLN COMPARISON:  CT abdomen and pelvis 11/04/2015 and 10/27/2015. FINDINGS: The patient has a small right pleural effusion. There is partial visualization of right lower lobe airspace disease with an appearance most worrisome for pneumonia. No left pleural effusion is identified. Mild dependent atelectasis is seen in the left lung base. No pericardial effusion. A drainage catheter is in place in the pelvis and traverses a fluid collection measuring 3.9 cm craniocaudal by 4.7 cm AP by 3.4 cm transverse. On the prior exam, the collection measured 5.6 cm transverse by 4.3 cm AP by approximately 5.0 cm craniocaudal. A second fluid collection more superiorly in the pelvis which had measured 4.4 by 5.8 cm measures 3.1 by 4.5 cm on today's study. Another fluid collection anterior to the  uterus which had measured approximately 5.5 cm transverse by 2.0 cm AP eat today measures 2.6 cm transverse by 0.9 cm AP. Two air and fluid collections along the lateral aspect of the sigmoid colon persists are slightly decreased in size. No new fluid collection is identified. The gallbladder has been removed.  The liver, spleen, adrenal glands, pancreas and kidneys are unremarkable. Diverticulosis is again identified. The stomach and small and large bowel appear normal. No focal bony abnormality is seen. IMPRESSION: Overall improved appearance of the abdomen with decrease in the size of the patient's multiple abscesses. Central portion of the drained collection in the central pelvis is only slightly smaller and if this collection persists the drain may need to be repositioned. Partial visualization of dense right lower lobe airspace disease most consistent with pneumonia. May need to be repositioned. Electronically Signed   By: Drusilla Kanner M.D.   On: 11/09/2015 12:01   Ct Image Guided Fluid Drain By Catheter  11/09/2015  INDICATION: Colonic diverticular abscess. EXAM: CT IMAGE GUIDED FLUID DRAIN BY CATHETER MEDICATIONS: The patient is currently admitted to the hospital and receiving intravenous antibiotics. The antibiotics were administered within an appropriate time frame prior to the initiation of the procedure. ANESTHESIA/SEDATION: None. The patient was continuously monitored during the procedure by the interventional radiology nurse under my direct supervision. COMPLICATIONS: None immediate. PROCEDURE: Informed written consent was obtained from the patient after a thorough discussion of the procedural risks, benefits and alternatives. All questions were addressed. A timeout was performed prior to the initiation of the procedure. Multiple contiguous axial images were obtained of the pelvis. These demonstrate the abscess noted on prior exam to be present, with decreased fluid around the tip of the catheter, but larger collection of fluid seen more inferiorly and posteriorly. Using external traction and CT guidance, the catheter was pulled back into the larger fluid area. Then, approximately 30 mL of thick tan fluid was aspirated. Follow-up CT imaging demonstrates the fluid collection to be nearly decompressed.  Catheter was then externally secured in its new position. It was hooked up to external drainage. No immediate complications were noted. IMPRESSION: Left trans gluteal pelvic drainage catheter was pulled back slightly using external traction under CT guidance, an 30 mL of thick tan fluid was removed. Follow-up imaging demonstrates the fluid collection to be nearly completely decompressed. Electronically Signed   By: Lupita Raider, M.D.   On: 11/09/2015 15:00    Anti-infectives: Anti-infectives    Start     Dose/Rate Route Frequency Ordered Stop   11/09/15 2200  vancomycin (VANCOCIN) IVPB 1000 mg/200 mL premix  Status:  Discontinued     1,000 mg 200 mL/hr over 60 Minutes Intravenous Every 8 hours 11/09/15 1434 11/10/15 1050   11/09/15 1430  vancomycin (VANCOCIN) IVPB 1000 mg/200 mL premix    Comments:  Pharmacy please f/u through levels and dose   1,000 mg 200 mL/hr over 60 Minutes Intravenous  Once 11/09/15 1321 11/09/15 1758   11/07/15 1400  piperacillin-tazobactam (ZOSYN) IVPB 3.375 g  Status:  Discontinued     3.375 g 12.5 mL/hr over 240 Minutes Intravenous 3 times per day 11/07/15 1115 11/07/15 1151   11/07/15 1200  piperacillin-tazobactam (ZOSYN) IVPB 3.375 g  Status:  Discontinued     3.375 g 100 mL/hr over 30 Minutes Intravenous 4 times per day 11/07/15 1059 11/07/15 1114   11/07/15 1200  ertapenem (INVANZ) 1 g in sodium chloride 0.9 % 50 mL IVPB     1  g 100 mL/hr over 30 Minutes Intravenous Every 24 hours 11/07/15 1154     11/04/15 2215  ceFEPIme (MAXIPIME) 2 g in dextrose 5 % 50 mL IVPB  Status:  Discontinued     2 g 100 mL/hr over 30 Minutes Intravenous 3 times per day 11/04/15 2212 11/07/15 1059   11/04/15 2212  metroNIDAZOLE (FLAGYL) IVPB 500 mg  Status:  Discontinued     500 mg 100 mL/hr over 60 Minutes Intravenous Every 8 hours 11/04/15 2212 11/07/15 1059   11/04/15 2045  ciprofloxacin (CIPRO) IVPB 400 mg     400 mg 200 mL/hr over 60 Minutes Intravenous  Once 11/04/15  2034 11/04/15 2212   11/04/15 2045  metroNIDAZOLE (FLAGYL) IVPB 500 mg     500 mg 100 mL/hr over 60 Minutes Intravenous  Once 11/04/15 2034 11/04/15 2200      Assessment/Plan: MRSA PCR was negative therefore am going to discontinue the vancomycin Continue in Invanz for diverticular abscess as well as to continue the drain Advance diet No surgical intervention at this point  Sterling Big, MD, FACS  11/10/2015

## 2015-11-11 ENCOUNTER — Telehealth: Payer: Self-pay | Admitting: Surgery

## 2015-11-11 LAB — GLUCOSE, CAPILLARY
Glucose-Capillary: 111 mg/dL — ABNORMAL HIGH (ref 65–99)
Glucose-Capillary: 85 mg/dL (ref 65–99)

## 2015-11-11 MED ORDER — METRONIDAZOLE 500 MG PO TABS: 500.0000 mg | ORAL_TABLET | Freq: Three times a day (TID) | ORAL | Status: DC

## 2015-11-11 MED ORDER — SULFAMETHOXAZOLE-TRIMETHOPRIM 800-160 MG PO TABS: 1.0000 | ORAL_TABLET | Freq: Two times a day (BID) | ORAL | Status: DC

## 2015-11-11 NOTE — Progress Notes (Signed)
Pt stable. PICC and drain removed per order. D/c instructions given and education provided. Electronic prescriptions verified. Pt states she understands instructions. Pt dressed and escorted out by staff. Driven home by family.

## 2015-11-11 NOTE — Discharge Instructions (Signed)
Diverticulitis °Diverticulitis is inflammation or infection of small pouches in your colon that form when you have a condition called diverticulosis. The pouches in your colon are called diverticula. Your colon, or large intestine, is where water is absorbed and stool is formed. °Complications of diverticulitis can include: °· Bleeding. °· Severe infection. °· Severe pain. °· Perforation of your colon. °· Obstruction of your colon. °CAUSES  °Diverticulitis is caused by bacteria. °Diverticulitis happens when stool becomes trapped in diverticula. This allows bacteria to grow in the diverticula, which can lead to inflammation and infection. °RISK FACTORS °People with diverticulosis are at risk for diverticulitis. Eating a diet that does not include enough fiber from fruits and vegetables may make diverticulitis more likely to develop. °SYMPTOMS  °Symptoms of diverticulitis may include: °· Abdominal pain and tenderness. The pain is normally located on the left side of the abdomen, but may occur in other areas. °· Fever and chills. °· Bloating. °· Cramping. °· Nausea. °· Vomiting. °· Constipation. °· Diarrhea. °· Blood in your stool. °DIAGNOSIS  °Your health care provider will ask you about your medical history and do a physical exam. You may need to have tests done because many medical conditions can cause the same symptoms as diverticulitis. Tests may include: °· Blood tests. °· Urine tests. °· Imaging tests of the abdomen, including X-rays and CT scans. °When your condition is under control, your health care provider may recommend that you have a colonoscopy. A colonoscopy can show how severe your diverticula are and whether something else is causing your symptoms. °TREATMENT  °Most cases of diverticulitis are mild and can be treated at home. Treatment may include: °· Taking over-the-counter pain medicines. °· Following a clear liquid diet. °· Taking antibiotic medicines by mouth for 7-10 days. °More severe cases may  be treated at a hospital. Treatment may include: °· Not eating or drinking. °· Taking prescription pain medicine. °· Receiving antibiotic medicines through an IV tube. °· Receiving fluids and nutrition through an IV tube. °· Surgery. °HOME CARE INSTRUCTIONS  °· Follow your health care provider's instructions carefully. °· Follow a full liquid diet or other diet as directed by your health care provider. After your symptoms improve, your health care provider may tell you to change your diet. He or she may recommend you eat a high-fiber diet. Fruits and vegetables are good sources of fiber. Fiber makes it easier to pass stool. °· Take fiber supplements or probiotics as directed by your health care provider. °· Only take medicines as directed by your health care provider. °· Keep all your follow-up appointments. °SEEK MEDICAL CARE IF:  °· Your pain does not improve. °· You have a hard time eating food. °· Your bowel movements do not return to normal. °SEEK IMMEDIATE MEDICAL CARE IF:  °· Your pain becomes worse. °· Your symptoms do not get better. °· Your symptoms suddenly get worse. °· You have a fever. °· You have repeated vomiting. °· You have bloody or black, tarry stools. °MAKE SURE YOU:  °· Understand these instructions. °· Will watch your condition. °· Will get help right away if you are not doing well or get worse. °  °This information is not intended to replace advice given to you by your health care provider. Make sure you discuss any questions you have with your health care provider. °  °Document Released: 06/07/2005 Document Revised: 09/02/2013 Document Reviewed: 07/23/2013 °Elsevier Interactive Patient Education ©2016 [employer] ° °

## 2015-11-11 NOTE — Discharge Summary (Signed)
Patient ID: Maria Mcbride MRN: [id number] DOB/AGE: Jun 27, 1976 39 y.o.  Admit date: 11/04/2015 Discharge date: 11/11/2015   Discharge Diagnoses:  Active Problems:   Diverticulitis   Abscess of abdominal cavity (HCC)   Abscess   Colonic diverticular abscess   Procedures: IR drainage of Diverticular abscess  Hospital Course: 40 yo female w hx of Dm admitted for abdominal pain , nausea. CT scan showed complicated recurrent diverticulitis w three pelvic abscesses and microperforation. She was admitted and placed NPO, IV antibiotics. Percutaneous drainage of largest collection done by IR. Initial improvement of clinical condition. WBC increased an repeat collection showed resolution of two pelvic abscess but still some collection in the deep pelvis, drained was repositioned by IR. She had significant improvement of sxs. E coli grew resistant to cipro. She was placed on invanz. CT scan revealed a pneumonia and sghe was empirically started on vanc. PCR was negative and this was stopped. At the time of DC she was ambulating, was tolerating PO. Afebrile and nml wbc. Drained was removed before DC. She will need colonoscopy and elective sigmoid colectomy in 6-8 weeks. She understands.  Consults:   Disposition: 01-Home or Self Care  Discharge Instructions    Call MD for:  difficulty breathing, headache or visual disturbances    Complete by:  As directed      Call MD for:  hives    Complete by:  As directed      Call MD for:  persistant dizziness or light-headedness    Complete by:  As directed      Call MD for:  persistant nausea and vomiting    Complete by:  As directed      Call MD for:  severe uncontrolled pain    Complete by:  As directed      Call MD for:  temperature >100.4    Complete by:  As directed      Diet - low sodium heart healthy    Complete by:  As directed      Increase activity slowly    Complete by:  As directed             Medication List    STOP taking these  medications        amoxicillin-clavulanate 875-125 MG tablet  Commonly known as:  AUGMENTIN      TAKE these medications        HYDROcodone-acetaminophen 5-325 MG tablet  Commonly known as:  NORCO/VICODIN  Take 1-2 tablets by mouth every 4 (four) hours as needed for moderate pain.     metFORMIN 500 MG 24 hr tablet  Commonly known as:  GLUCOPHAGE-XR  Take 500 mg by mouth 2 (two) times daily.     metroNIDAZOLE 500 MG tablet  Commonly known as:  FLAGYL  Take 1 tablet (500 mg total) by mouth 3 (three) times daily.     ondansetron 4 MG disintegrating tablet  Commonly known as:  ZOFRAN ODT  Take 1 tablet (4 mg total) by mouth every 8 (eight) hours as needed for nausea or vomiting.     sulfamethoxazole-trimethoprim 800-160 MG tablet  Commonly known as:  BACTRIM DS,SEPTRA DS  Take 1 tablet by mouth 2 (two) times daily.           Follow-up Information    Follow up with Leafy Ro, MD In 2 weeks.   Specialty:  General Surgery   Why:  diverticulitis. Ok 2-3 weeks   Contact information:   [address] [place] [address] [phone]        Sterling Big, MD FACS

## 2015-11-11 NOTE — Telephone Encounter (Signed)
Patient was readmitted to hospital and needs an extension on fmla. They said that if Dr Excell Seltzer would sign off on it you would just have to write a note on it for 2 weeks from tomorrow and his signature with case number and chart notes. I have not received any new paper work and told patient.

## 2015-11-11 NOTE — Progress Notes (Signed)
Pharmacy Antibiotic Note  Maria Mcbride is a 40 y.o. female admitted on 11/04/2015 with Diverticulitis and intra-abdominal infection.  Pharmacy has been consulted for Ertapenem dosing.  Culture (abscess): ESBL positive   Plan: Culture results from abscess ESBL positive. Spoke with Dr. Tonita Cong about starting ertapenem for ESBL coverage.  Continue Ertapenem 1gm IV daily.   Height:  (157.5 cm) Weight: 198 lb 1.6 oz (89.858 kg) IBW/kg (Calculated) : 50.1  Temp (24hrs), Avg:98.2 F (36.8 C), Min:97.4 F (36.3 C), Max:98.7 F (37.1 C)   Recent Labs Lab 11/05/15 0631 11/06/15 0635 11/07/15 0544 11/08/15 0558 11/09/15 0508 11/10/15 0626  WBC 14.4* 13.5* 11.7* 13.3* 13.7* 10.1  CREATININE 0.61 0.55 0.63 0.64 0.69  --     Estimated Creatinine Clearance: 97.4 mL/min (by C-G formula based on Cr of 0.69).    No Known Allergies  Antimicrobials this admission: 02/23- 2/26>> cefepime 02/23-02/23 >> ciprofloxacin  02/23-02/26 >> Metronidazole 02/26- >> Ertapenem  02/28- >> 3/1 Vancomycin     Microbiology results: 2/24 Abscess Cx: ESBL positive, moderate growth E.Coli  Thank you for allowing pharmacy to be a part of this patient's care.  Olene Floss, Pharm.D Clinical Pharmacist   11/11/2015 11:34 AM

## 2015-11-12 ENCOUNTER — Telehealth: Payer: Self-pay

## 2015-11-12 LAB — CULTURE, ROUTINE-ABSCESS

## 2015-11-12 NOTE — Telephone Encounter (Signed)
Needs disability paperwork updated to return on 11/30/15 after post discharge visit. Patient was readmitted to hospital on 11/04/15.

## 2015-11-14 LAB — CULTURE, BLOOD (ROUTINE X 2)
CULTURE: NO GROWTH
CULTURE: NO GROWTH

## 2015-11-15 ENCOUNTER — Ambulatory Visit: Payer: BLUE CROSS/BLUE SHIELD | Admitting: Surgery

## 2015-11-15 NOTE — Telephone Encounter (Signed)
Paperwork was filled out and faxed with new information.

## 2015-11-19 ENCOUNTER — Telehealth: Payer: Self-pay | Admitting: Surgery

## 2015-11-19 NOTE — Telephone Encounter (Signed)
Returned phone call to patient at this time. No answer. Left voicemail for return phone call. 

## 2015-11-19 NOTE — Telephone Encounter (Signed)
Having pain again and would like to talk to someone before the weekend. No fever. Pain is in right lower side. She was treated for diverticulitis

## 2015-11-20 ENCOUNTER — Telehealth: Payer: Self-pay | Admitting: Surgery

## 2015-11-20 NOTE — Telephone Encounter (Signed)
Patient called stated having some intermittent pain in the RLQ particularly when her bladder gets full.  She just stopped Abx tx from Diverticultiis with abscess that required drainage on Thursday.  She denies pain in the left but states she had pain in the right with one of her bout of diverticultiis.  Patient denies any fever, chills, palpations, nausea, vomiting, constipation or dysuria. I discussed with her that if her pain worsens or she develops fever or chills or worsening abdominal pain that is severe and constant that she should come to the emergency department to be evaluated.

## 2015-11-22 ENCOUNTER — Telehealth: Payer: Self-pay

## 2015-11-22 DIAGNOSIS — K5730 Diverticulosis of large intestine without perforation or abscess without bleeding: Secondary | ICD-10-CM

## 2015-11-22 DIAGNOSIS — R109 Unspecified abdominal pain: Secondary | ICD-10-CM

## 2015-11-22 NOTE — Telephone Encounter (Signed)
Pt would like a return call regarding her abdominal pain. Pt stated she spoke with Dr. Orvis BrillLoflin on Saturday ( see note in chart) and she recommend her call us on Monday and speak with a nurse regarding her symptoms. She says her pain isn't bad enough for the emergency room. She says she isn't sure what Dr. Orvis BrillLoflin wanted her to do as she says Loflin doesn't know her. Please advise. [phone].

## 2015-11-23 ENCOUNTER — Ambulatory Visit
Admission: RE | Admit: 2015-11-23 | Discharge: 2015-11-23 | Disposition: A | Discharge status: Home / Self Care | Payer: BLUE CROSS/BLUE SHIELD | Source: Ambulatory Visit | Attending: Surgery | Admitting: Surgery

## 2015-11-23 ENCOUNTER — Other Ambulatory Visit
Admission: RE | Admit: 2015-11-23 | Discharge: 2015-11-23 | Disposition: A | Discharge status: Home / Self Care | Payer: BLUE CROSS/BLUE SHIELD | Source: Ambulatory Visit | Attending: Surgery | Admitting: Surgery

## 2015-11-23 ENCOUNTER — Telehealth: Payer: Self-pay

## 2015-11-23 DIAGNOSIS — K5780 Diverticulitis of intestine, part unspecified, with perforation and abscess without bleeding: Secondary | ICD-10-CM | POA: Diagnosis not present

## 2015-11-23 DIAGNOSIS — R109 Unspecified abdominal pain: Secondary | ICD-10-CM

## 2015-11-23 DIAGNOSIS — K5732 Diverticulitis of large intestine without perforation or abscess without bleeding: Secondary | ICD-10-CM

## 2015-11-23 DIAGNOSIS — K5730 Diverticulosis of large intestine without perforation or abscess without bleeding: Secondary | ICD-10-CM

## 2015-11-23 LAB — CBC WITH DIFFERENTIAL/PLATELET
BASOS ABS: 0.1 10*3/uL (ref 0–0.1)
Basophils Relative: 1 %
Eosinophils Absolute: 0.1 10*3/uL (ref 0–0.7)
Eosinophils Relative: 2 %
HEMATOCRIT: 32.9 % — AB (ref 35.0–47.0)
Hemoglobin: 10.7 g/dL — ABNORMAL LOW (ref 12.0–16.0)
LYMPHS ABS: 2.1 10*3/uL (ref 1.0–3.6)
LYMPHS PCT: 31 %
MCH: 27.8 pg (ref 26.0–34.0)
MCHC: 32.5 g/dL (ref 32.0–36.0)
MCV: 85.7 fL (ref 80.0–100.0)
MONO ABS: 0.5 10*3/uL (ref 0.2–0.9)
MONOS PCT: 8 %
NEUTROS ABS: 3.8 10*3/uL (ref 1.4–6.5)
NEUTROS PCT: 58 %
Platelets: 214 10*3/uL (ref 150–440)
RBC: 3.84 MIL/uL (ref 3.80–5.20)
RDW: 16.6 % — AB (ref 11.5–14.5)
WBC: 6.5 10*3/uL (ref 3.6–11.0)

## 2015-11-23 LAB — COMPREHENSIVE METABOLIC PANEL
ALT: 41 U/L (ref 14–54)
AST: 25 U/L (ref 15–41)
Albumin: 4.1 g/dL (ref 3.5–5.0)
Alkaline Phosphatase: 56 U/L (ref 38–126)
Anion gap: 5 (ref 5–15)
BILIRUBIN TOTAL: 0.3 mg/dL (ref 0.3–1.2)
BUN: 12 mg/dL (ref 6–20)
CO2: 27 mmol/L (ref 22–32)
CREATININE: 0.81 mg/dL (ref 0.44–1.00)
Calcium: 8.3 mg/dL — ABNORMAL LOW (ref 8.9–10.3)
Chloride: 104 mmol/L (ref 101–111)
GFR calc Af Amer: >60 mL/min (ref >60)
Glucose, Bld: 103 mg/dL — ABNORMAL HIGH (ref 65–99)
Potassium: 3.8 mmol/L (ref 3.5–5.1)
Sodium: 136 mmol/L (ref 135–145)
TOTAL PROTEIN: 7.4 g/dL (ref 6.5–8.1)

## 2015-11-23 MED ORDER — IOHEXOL 300 MG/ML  SOLN
100.0000 mL | Freq: Once | INTRAMUSCULAR | Status: AC | PRN
  Administered 2015-11-23: 100 mL via INTRAVENOUS

## 2015-11-23 NOTE — Telephone Encounter (Addendum)
Returned phone call to patient at this time. She continues to have Left sided abdominal pain with some intermittent nausea. Denies fever or chills and bowel movements are normal for patient. Spoke with Dr. Everlene FarrierPabon in office about patient. He has ordered STAT - CT of Abdomen and Pelvis and CBC, CMP.  Patient has been placed on CT schedule for 1330 today, she will have labwork done prior to this. We will see patient in office tomorrow am, she been given appt with Dr. Everlene FarrierPabon at 10am. Will call if CT or labwork extremely abnormal today.

## 2015-11-23 NOTE — Telephone Encounter (Signed)
Reviewed CT Scan and Labwork for this patient with Dr. Everlene FarrierPabon. He would like patient to keep appointment for tomorrow but results look ok.  Called patient at this time. Results given. She will keep appointment for tomorrow.

## 2015-11-23 NOTE — Addendum Note (Signed)
Addended by: Arty BaumgartnerSTRUNK, Maylin [place] G on: 11/23/2015 09:36 AM   Modules accepted: Orders

## 2015-11-24 ENCOUNTER — Ambulatory Visit (INDEPENDENT_AMBULATORY_CARE_PROVIDER_SITE_OTHER): Payer: BLUE CROSS/BLUE SHIELD | Admitting: Primary Care

## 2015-11-24 ENCOUNTER — Ambulatory Visit (INDEPENDENT_AMBULATORY_CARE_PROVIDER_SITE_OTHER): Payer: BLUE CROSS/BLUE SHIELD | Admitting: Surgery

## 2015-11-24 ENCOUNTER — Encounter: Payer: Self-pay | Admitting: Primary Care

## 2015-11-24 ENCOUNTER — Encounter: Payer: Self-pay | Admitting: Surgery

## 2015-11-24 VITALS — BP 132/90 | HR 78 | Temp 97.8°F | Ht 62.0 in | Wt 188.0 lb

## 2015-11-24 VITALS — BP 141/91 | HR 93 | Temp 98.3°F | Wt 188.0 lb

## 2015-11-24 DIAGNOSIS — I10 Essential (primary) hypertension: Secondary | ICD-10-CM | POA: Diagnosis not present

## 2015-11-24 DIAGNOSIS — E119 Type 2 diabetes mellitus without complications: Secondary | ICD-10-CM | POA: Insufficient documentation

## 2015-11-24 DIAGNOSIS — K5720 Diverticulitis of large intestine with perforation and abscess without bleeding: Secondary | ICD-10-CM | POA: Diagnosis not present

## 2015-11-24 DIAGNOSIS — K5732 Diverticulitis of large intestine without perforation or abscess without bleeding: Secondary | ICD-10-CM

## 2015-11-24 DIAGNOSIS — E1165 Type 2 diabetes mellitus with hyperglycemia: Secondary | ICD-10-CM | POA: Insufficient documentation

## 2015-11-24 DIAGNOSIS — R0683 Snoring: Secondary | ICD-10-CM | POA: Diagnosis not present

## 2015-11-24 DIAGNOSIS — R7303 Prediabetes: Secondary | ICD-10-CM | POA: Diagnosis not present

## 2015-11-24 MED ORDER — METFORMIN HCL ER 500 MG PO TB24: 500.0000 mg | ORAL_TABLET | Freq: Two times a day (BID) | ORAL | Status: DC

## 2015-11-24 MED ORDER — OLMESARTAN MEDOXOMIL-HCTZ 20-12.5 MG PO TABS: 1.0000 | ORAL_TABLET | Freq: Every day | ORAL | Status: DC

## 2015-11-24 NOTE — Progress Notes (Signed)
Subjective:    Patient ID: Maria Mcbride, female    DOB: [birth year], 40 y.o.   MRN: [id number]  HPI  Maria Mcbride is a 40 year old female who presents today to establish care and discuss the problems mentioned below. Will obtain old records. Her last physical was one year ago.   1) Hospital Follow Up: Presented to ED with complaints of abdominal pain. Had a prior admission for diverticulitis with micro-perforation that did not require operation. Her pain returned again to the LLQ which is when she presented to the ED on 11/04/15. Her pain did not improve in the ED and was also noted to have multiple pelvic abscess, elevated WBC count so she was admitted.  During her hospital stay she had a transgluteal drain placed into the pelvis. She was treated with IV antibiotics and fluids. Her WBC count improved, then declined. Her drain was repositioned by IR with improvement in pain. She was also diagnosed with pneumonia during her hospitalization. Her drain was removed prior to discharge and was sent home with recommendations for colonoscopy and elective sigmoid colectomy in 6-8 weeks. She was discharged home on 11/11/15.  Since her discharge she's completed her oral antibioticsThrusday last week. She was evaluated by her surgeon yesterday who completed a CT of the abdomen/pelvis and CBC with normal results. She continues to experience pain, only to her RLQ. The surgeon believes it is likely MSK in nature. Her next follow up is scheduled in 2 weeks. Denies fevers, chills, weakness, fatigue.  2) Essential Hypertension: Diagnosed 2 years ago. Currently managed on olmesartan-HCTZ 20/12.5 mg. She was once managed on an ACE but could not tolerate due to cough. She was removed from her medication during her hospitalization as her BP was low then. She's noticed her BP increasing today so she restarted her medication morning.   3) Borderline Diabetes: Currently managed on Metformin XR  500 mg BID. She had an elevated  A1C and was symptomatic with fatigue after eating so she was initiated on treatment. She has a strong FH of diabetes. Last A1C was 1 year ago. She has not noticed symptoms since initiation of treatment.  4) OSA: Frequently wakes up at night gasping for air, experiences daytime tiredness, and snores. Family history of sleep apnea with mother and sister. She's lost 23 pounds since her hospitalization. She attempted to obtain a sleep study last year but could not afford.    Review of Systems  Constitutional: Negative for fever, chills and fatigue.  HENT:       Snoring   Respiratory: Negative for shortness of breath.   Cardiovascular: Negative for chest pain.  Gastrointestinal: Positive for abdominal pain. Negative for nausea.  Endocrine: Negative for polydipsia and polyuria.  Genitourinary: Negative for pelvic pain.  Musculoskeletal: Negative for arthralgias.  Skin:       Mild bruising remains from lovenox injections.  Neurological: Negative for weakness and headaches.       Past Medical History  Diagnosis Date  . Diabetes mellitus without complication (HCC)     Pt is not taking meds at this tinme 11/23/15 ASW  . Hypertension     Not taking meds at this time 11/23/15 ASW    Social History   Social History  . Marital Status: Married    Spouse Name: N/A  . Number of Children: N/A  . Years of Education: N/A   Occupational History  . Not on file.   Social History Main Topics  . Smoking  status: Never Smoker   . Smokeless tobacco: Never Used  . Alcohol Use: No  . Drug Use: No  . Sexual Activity: Not on file   Other Topics Concern  . Not on file   Social History Narrative   Married.   2 children.    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     No past surgical history on file.  Family History  Problem Relation Age of Onset  . Hyperlipidemia Mother   . Hypothyroidism Sister   . Sleep apnea Mother   . Diabetes Maternal Grandmother      No Known Allergies  Current Outpatient Prescriptions on File Prior to Visit  Medication Sig Dispense Refill  . HYDROcodone-acetaminophen (NORCO/VICODIN) 5-325 MG tablet Take 1-2 tablets by mouth every 4 (four) hours as needed for moderate pain. 30 tablet 0   No current facility-administered medications on file prior to visit.    BP 132/90 mmHg  Pulse 78  Temp(Src) 97.8 F (36.6 C) (Oral)  Ht  (1.575 m)  Wt 188 lb (85.276 kg)  BMI 34.38 kg/m2  SpO2 97%  LMP 10/27/2015    Objective:   Physical Exam  Constitutional: She is oriented to person, place, and time. She appears well-nourished.  Neck: Neck supple.  Cardiovascular: Normal rate and regular rhythm.   Pulmonary/Chest: Effort normal and breath sounds normal.  Abdominal: Soft. Bowel sounds are normal. There is tenderness in the right lower quadrant. There is no tenderness at McBurney's point.  Neurological: She is alert and oriented to person, place, and time.  Skin: Skin is warm and dry.  Psychiatric: She has a normal mood and affect.          Assessment & Plan:  Hospital Follow Up:  Admitted in late February for infectious diverticulitis. Also noted to have multiple pelvic abscesses.  Treated with IV antibiotics. Also developed pneumonia during stay. Overall feeling improved, some RLQ pain. No diarrhea, fevers, fatigue. Currently following with surgeon and will be going for colonoscopy and elective colectomy in 6-8 weeks. Exam stable today.  All hospital labs, imaging, and notes reviewed.

## 2015-11-24 NOTE — Assessment & Plan Note (Signed)
Endorses elevated A1C 1 year ago. Currently on Metformin XR 500 mg BID. Asymptomatic. Will recheck A1C at upcoming physical.

## 2015-11-24 NOTE — Assessment & Plan Note (Signed)
Recent hospitalization with discharge on 03/02. Underwent treatment with antibiotics and is following with surgery for future colonoscopy and elective colectomy in 6-8 weeks. Tender to RLQ today. No other symptoms.  Stable exam.

## 2015-11-24 NOTE — Assessment & Plan Note (Signed)
Currently managed on Benicar. Just restarted medication this morning as it was held in the hospital due to hypotension. Will continue to monitor. BP slightly above goal today, suspect will normalize in several days.

## 2015-11-24 NOTE — Patient Instructions (Signed)
We will call you once we receive your lab result whether or not you will need antibiotics again.

## 2015-11-24 NOTE — Patient Instructions (Signed)
You will be contacted regarding your referral to Pulmonology for your sleep evaluation.  Please let us know if you have not heard back within one week.   I sent refills of your Metformin and Benicar to [facility].  Please schedule a physical with me within the next 3 months. You may also schedule a lab only appointment 3-4 days prior. We will discuss your lab results in detail during your physical.  It was a pleasure to meet you today! Please don't hesitate to call me with any questions. Welcome to [facility]!

## 2015-11-24 NOTE — Progress Notes (Signed)
His cough is following for complicated diverticulitis. She had a history of pelvic abscess drain by IR Now comes with an increase in right lower quadrant pain and some diarrhea. She denies any fevers and chills. She is able to tolerate by mouth without nausea or vomiting. I have ordered a CT scan I have personally reviewed there is significant improvement of diverticulitis and abscess with no evidence of a drainable collection. There is no free air and there is no evidence of perforation. Labs are nml   PE: NAD good spirits Chest: NSR, s1,s2. Lungs: CTA. Right sided chest wall tenderness  Abd: soft, Mild TTP RLQ, no peritonitis, no masses or hernia Ext: well perfused, no edema  A/P Diverticulitis completed antibiotics Will order C diff and F/U in 2 weeks She was concerned about IUD causing some of the pain and I ask her to please F/U w her Gyn No surgical intervention

## 2015-11-24 NOTE — Assessment & Plan Note (Signed)
Endorses snoring, frequent periods of apnea at night, daytime tiredness. Referral placed to pulmonology for sleep study.

## 2015-11-24 NOTE — Progress Notes (Signed)
Pre visit review using our clinic review tool, if applicable. No additional management support is needed unless otherwise documented below in the visit note. 

## 2015-11-25 ENCOUNTER — Other Ambulatory Visit
Admission: RE | Admit: 2015-11-25 | Discharge: 2015-11-25 | Disposition: A | Discharge status: Home / Self Care | Payer: BLUE CROSS/BLUE SHIELD | Source: Ambulatory Visit | Attending: Surgery | Admitting: Surgery

## 2015-11-25 DIAGNOSIS — K5732 Diverticulitis of large intestine without perforation or abscess without bleeding: Secondary | ICD-10-CM | POA: Insufficient documentation

## 2015-11-25 LAB — C DIFFICILE QUICK SCREEN W PCR REFLEX
C DIFFICILE (CDIFF) TOXIN: NEGATIVE
C DIFFICLE (CDIFF) ANTIGEN: NEGATIVE
C Diff interpretation: NEGATIVE

## 2015-11-29 ENCOUNTER — Ambulatory Visit: Payer: BLUE CROSS/BLUE SHIELD | Admitting: Surgery

## 2015-11-29 ENCOUNTER — Telehealth: Payer: Self-pay | Admitting: Surgery

## 2015-11-29 NOTE — Telephone Encounter (Signed)
Patient returning to work tomorrow and needs a return to work note. Maria Mcbride told her that they faxed the form and it was with her disability paperwork or just hand write a note with restrictions. If none, please note that.

## 2015-11-30 ENCOUNTER — Encounter: Payer: Self-pay | Admitting: Primary Care

## 2015-12-02 ENCOUNTER — Ambulatory Visit (INDEPENDENT_AMBULATORY_CARE_PROVIDER_SITE_OTHER): Payer: BLUE CROSS/BLUE SHIELD | Admitting: Surgery

## 2015-12-02 ENCOUNTER — Other Ambulatory Visit: Payer: Self-pay

## 2015-12-02 ENCOUNTER — Encounter: Payer: Self-pay | Admitting: Surgery

## 2015-12-02 VITALS — BP 135/87 | HR 87 | Temp 98.5°F | Ht 62.0 in | Wt 185.4 lb

## 2015-12-02 DIAGNOSIS — K5720 Diverticulitis of large intestine with perforation and abscess without bleeding: Secondary | ICD-10-CM

## 2015-12-02 MED ORDER — NA SULFATE-K SULFATE-MG SULF 17.5-3.13-1.6 GM/177ML PO SOLN: 1.0000 | ORAL | Status: DC

## 2015-12-02 NOTE — Progress Notes (Signed)
Maria Mcbride is following up after an episode of acute diverticulitis with abscess status post drain placement. She is currently feeling very well, pain free she is tolerating diet and having bowel movements. She's been off antibiotics. She has never had a colonoscopy before. No other complaints at this time  PE: No acute distress awake alert Abdomen: Soft, nontender no peritonitis  A/P Recurrent diverticulitis with a previous complicated episode in need for elective sigmoid colectomy. Before proceeding with colectomy we will ask GI for a colonoscopy to make sure there is no acid malignancy. I will see her back after colonoscopy is done and discussed with her in more detail about sigmoid colectomy. Today. Discussed with her about what to expect, recovery time, risks benefits and possible complications. She understands and agrees w the plan

## 2015-12-02 NOTE — Patient Instructions (Addendum)
You will need a Colonoscopy done in the next few weeks. We will arrange for this to be done on 12/16/15 with Dr. Servando [facility]areWohl [facility]. Please see the directions that you have been given today. We have sent your prep in to the pharmacy.  We will see you back in office afterwards to discuss surgery.

## 2015-12-02 NOTE — Telephone Encounter (Signed)
Amber, can you please help me out on this. I reviewed Dr. Hurman HornPabon's notes from today and it does not show any type of restrictions for this patient. Are you able to tell me if she does or not so I could fax her disability form? Thank you.

## 2015-12-03 NOTE — Telephone Encounter (Signed)
You has returned to work on 3/21 without restrictions. We will be doing a colonoscopy on 12/16/15 and then we will see her back on 4/12 to talk about a Colectomy. So then she will have her surgery after that point.

## 2015-12-08 ENCOUNTER — Encounter: Payer: Self-pay | Admitting: Primary Care

## 2015-12-08 ENCOUNTER — Ambulatory Visit: Payer: Self-pay | Admitting: Surgery

## 2015-12-09 ENCOUNTER — Ambulatory Visit: Payer: Self-pay | Admitting: Surgery

## 2015-12-14 NOTE — Discharge Instructions (Signed)

## 2015-12-16 ENCOUNTER — Encounter
Admission: RE | Disposition: A | Discharge status: Home / Self Care | Payer: Self-pay | Source: Ambulatory Visit | Attending: Gastroenterology

## 2015-12-16 ENCOUNTER — Ambulatory Visit
Admission: RE | Admit: 2015-12-16 | Discharge: 2015-12-16 | Disposition: A | Discharge status: Home / Self Care | Payer: BLUE CROSS/BLUE SHIELD | Source: Ambulatory Visit | Attending: Gastroenterology | Admitting: Gastroenterology

## 2015-12-16 ENCOUNTER — Ambulatory Visit: Payer: BLUE CROSS/BLUE SHIELD | Admitting: Anesthesiology

## 2015-12-16 DIAGNOSIS — Z8719 Personal history of other diseases of the digestive system: Secondary | ICD-10-CM | POA: Diagnosis present

## 2015-12-16 DIAGNOSIS — G4730 Sleep apnea, unspecified: Secondary | ICD-10-CM | POA: Insufficient documentation

## 2015-12-16 DIAGNOSIS — E119 Type 2 diabetes mellitus without complications: Secondary | ICD-10-CM | POA: Diagnosis not present

## 2015-12-16 DIAGNOSIS — Z7984 Long term (current) use of oral hypoglycemic drugs: Secondary | ICD-10-CM | POA: Insufficient documentation

## 2015-12-16 DIAGNOSIS — Z79899 Other long term (current) drug therapy: Secondary | ICD-10-CM | POA: Diagnosis not present

## 2015-12-16 DIAGNOSIS — Z833 Family history of diabetes mellitus: Secondary | ICD-10-CM | POA: Diagnosis not present

## 2015-12-16 DIAGNOSIS — Z8489 Family history of other specified conditions: Secondary | ICD-10-CM | POA: Insufficient documentation

## 2015-12-16 DIAGNOSIS — K5730 Diverticulosis of large intestine without perforation or abscess without bleeding: Secondary | ICD-10-CM | POA: Insufficient documentation

## 2015-12-16 DIAGNOSIS — K5732 Diverticulitis of large intestine without perforation or abscess without bleeding: Secondary | ICD-10-CM | POA: Insufficient documentation

## 2015-12-16 DIAGNOSIS — Z8349 Family history of other endocrine, nutritional and metabolic diseases: Secondary | ICD-10-CM | POA: Insufficient documentation

## 2015-12-16 DIAGNOSIS — I10 Essential (primary) hypertension: Secondary | ICD-10-CM | POA: Insufficient documentation

## 2015-12-16 DIAGNOSIS — K641 Second degree hemorrhoids: Secondary | ICD-10-CM | POA: Insufficient documentation

## 2015-12-16 DIAGNOSIS — K219 Gastro-esophageal reflux disease without esophagitis: Secondary | ICD-10-CM | POA: Insufficient documentation

## 2015-12-16 HISTORY — DX: Pneumonia, unspecified organism: J18.9

## 2015-12-16 HISTORY — DX: Sleep apnea, unspecified: G47.30

## 2015-12-16 HISTORY — PX: COLONOSCOPY WITH PROPOFOL: SHX5780

## 2015-12-16 LAB — GLUCOSE, CAPILLARY: GLUCOSE-CAPILLARY: 100 mg/dL — AB (ref 65–99)

## 2015-12-16 SURGERY — COLONOSCOPY WITH PROPOFOL
Anesthesia: Monitor Anesthesia Care

## 2015-12-16 MED ORDER — STERILE WATER FOR IRRIGATION IR SOLN
Status: DC | PRN
  Administered 2015-12-16: 09:00:00

## 2015-12-16 MED ORDER — SODIUM CHLORIDE 0.9 % IV SOLN: INTRAVENOUS | Status: DC

## 2015-12-16 MED ORDER — LIDOCAINE HCL (CARDIAC) 20 MG/ML IV SOLN
INTRAVENOUS | Status: DC | PRN
  Administered 2015-12-16: 20 mg via INTRAVENOUS

## 2015-12-16 MED ORDER — PROPOFOL 10 MG/ML IV BOLUS
INTRAVENOUS | Status: DC | PRN
  Administered 2015-12-16 (×2): 20 mg via INTRAVENOUS
  Administered 2015-12-16: 100 mg via INTRAVENOUS
  Administered 2015-12-16 (×3): 20 mg via INTRAVENOUS

## 2015-12-16 MED ORDER — LACTATED RINGERS IV SOLN
INTRAVENOUS | Status: DC
  Administered 2015-12-16: 09:00:00 via INTRAVENOUS

## 2015-12-16 SURGICAL SUPPLY — 21 items
CANISTER SUCT 1200ML W/VALVE (MISCELLANEOUS) ×3 IMPLANT
CLIP HMST 235XBRD CATH ROT (MISCELLANEOUS) IMPLANT
CLIP RESOLUTION 360 11X235 (MISCELLANEOUS)
FCP ESCP3.2XJMB 240X2.8X (MISCELLANEOUS)
FORCEPS BIOP RAD 4 LRG CAP 4 (CUTTING FORCEPS) IMPLANT
FORCEPS BIOP RJ4 240 W/NDL (MISCELLANEOUS)
FORCEPS ESCP3.2XJMB 240X2.8X (MISCELLANEOUS) IMPLANT
GOWN CVR UNV OPN BCK APRN NK (MISCELLANEOUS) ×2 IMPLANT
GOWN ISOL THUMB LOOP REG UNIV (MISCELLANEOUS) ×4
INJECTOR VARIJECT VIN23 (MISCELLANEOUS) IMPLANT
KIT DEFENDO VALVE AND CONN (KITS) IMPLANT
KIT ENDO PROCEDURE OLY (KITS) ×3 IMPLANT
MARKER SPOT ENDO TATTOO 5ML (MISCELLANEOUS) IMPLANT
PAD GROUND ADULT SPLIT (MISCELLANEOUS) IMPLANT
PROBE APC STR FIRE (PROBE) ×3 IMPLANT
SNARE SHORT THROW 13M SML OVAL (MISCELLANEOUS) IMPLANT
SNARE SHORT THROW 30M LRG OVAL (MISCELLANEOUS) IMPLANT
SPOT EX ENDOSCOPIC TATTOO (MISCELLANEOUS)
VARIJECT INJECTOR VIN23 (MISCELLANEOUS)
WATER STERILE IRR 250ML POUR (IV SOLUTION) ×3 IMPLANT
WIDE-EYE POLYPTRAP (MISCELLANEOUS) IMPLANT

## 2015-12-16 NOTE — Transfer of Care (Signed)
Immediate Anesthesia Transfer of Care Note  Patient: Maria Mcbride  Procedure(s) Performed: Procedure(s) with comments: COLONOSCOPY WITH PROPOFOL (N/A) - DIABETIC/ ORAL MED/ IUD  Patient Location: PACU  Anesthesia Type: MAC  Level of Consciousness: awake, alert  and patient cooperative  Airway and Oxygen Therapy: Patient Spontanous Breathing and Patient connected to supplemental oxygen  Post-op Assessment: Post-op Vital signs reviewed, Patient's Cardiovascular Status Stable, Respiratory Function Stable, Patent Airway and No signs of Nausea or vomiting  Post-op Vital Signs: Reviewed and stable  Complications: No apparent anesthesia complications

## 2015-12-16 NOTE — H&P (Signed)
[facility]  [address] [place], [place] [address] Phone: [phone] Fax : [phone]  Primary Care Physician:  Clark,Katherine Kendal, NP Primary Gastroenterologist:  Dr. Wohl  Pre-Procedure History & Physical: HPI:  Maria Mcbride is a 39 y.o. female is here for an colonoscopy.   Past Medical History  Diagnosis Date  . GERD (gastroesophageal reflux disease)   . Diverticulitis   . Hypertension     CONTROLLED ON MEDS  . Pneumonia FEB 26,2017    DR SAYS CLEAR  . Diabetes mellitus without complication (HCC)     BORDERLINE  . Sleep apnea     PATIENT THINKS MAY HAVE/ HAS APPT WITH PULMONARY DR AT [facility]    Past Surgical History  Procedure Laterality Date  . Cesarean section  2007 and 2011  . Cholecystectomy  2001    Dr. [facility]  . Trans gluteal drain placement      SEDATION    Prior to Admission medications   Medication Sig Start Date End Date Taking? Authorizing Provider  Multiple Vitamin (MULTIVITAMIN) tablet Take 1 tablet by mouth daily.   Yes Historical Provider, MD  vitamin C (ASCORBIC ACID) 500 MG tablet Take 500 mg by mouth daily.   Yes Historical Provider, MD  metFORMIN (GLUCOPHAGE-XR) 500 MG 24 hr tablet Take 1 tablet (500 mg total) by mouth 2 (two) times daily. 11/24/15   Katherine K Clark, NP  Na Sulfate-K Sulfate-Mg Sulf (SUPREP BOWEL PREP) SOLN Take 1 kit by mouth as directed. 12/02/15   Darren Wohl, MD  olmesartan-hydrochlorothiazide (BENICAR HCT) 20-12.5 MG tablet Take 1 tablet by mouth daily. Patient taking differently: Take 1 tablet by mouth daily. AM 11/24/15   Katherine K Clark, NP  Probiotic Product (PROBIOTIC ACIDOPHILUS BEADS PO) Take 1 tablet by mouth daily. Reported on 12/14/2015    Historical Provider, MD    Allergies as of 12/02/2015  . (No Known Allergies)    Family History  Problem Relation Age of Onset  . Hyperlipidemia Mother   . Hypothyroidism Sister   . Sleep apnea Mother   . Diabetes Maternal Grandmother     Social  History   Social History  . Marital Status: Married    Spouse Name: N/A  . Number of Children: N/A  . Years of Education: N/A   Occupational History  . Not on file.   Social History Main Topics  . Smoking status: Never Smoker   . Smokeless tobacco: Never Used  . Alcohol Use: No  . Drug Use: No  . Sexual Activity: Not on file   Other Topics Concern  . Not on file   Social History Narrative   Married.   2 children.    Works as a pharmacist.   Enjoys camping, being out doors, water skiiing.     Review of Systems: See HPI, otherwise negative ROS  Physical Exam: Ht 5' 2" (1.575 m)  Wt 185 lb (83.915 kg)  BMI 33.83 kg/m2 General:   Alert,  pleasant and cooperative in NAD Head:  Normocephalic and atraumatic. Neck:  Supple; no masses or thyromegaly. Lungs:  Clear throughout to auscultation.    Heart:  Regular rate and rhythm. Abdomen:  Soft, nontender and nondistended. Normal bowel sounds, without guarding, and without rebound.   Neurologic:  Alert and  oriented x4;  grossly normal neurologically.  Impression/Plan: Maria Mcbride is here for an colonoscopy to be performed for history of diverticulitis  Risks, benefits, limitations, and alternatives regarding  colonoscopy have been   reviewed with the patient.  Questions have been answered.  All parties agreeable.   Ollen Bowl, MD  12/16/2015, 8:22 AM

## 2015-12-16 NOTE — Anesthesia Postprocedure Evaluation (Signed)
Anesthesia Post Note  Patient: Maria Mcbride  Procedure(s) Performed: Procedure(s) (LRB): COLONOSCOPY WITH PROPOFOL (N/A)  Patient location during evaluation: PACU Anesthesia Type: MAC Level of consciousness: awake and alert Pain management: pain level controlled Vital Signs Assessment: post-procedure vital signs reviewed and stable Respiratory status: spontaneous breathing, nonlabored ventilation, respiratory function stable and patient connected to nasal cannula oxygen Cardiovascular status: blood pressure returned to baseline and stable Postop Assessment: no signs of nausea or vomiting Anesthetic complications: no    Scarlette Sliceachel B Beach

## 2015-12-16 NOTE — Anesthesia Preprocedure Evaluation (Signed)
Anesthesia Evaluation  Patient identified by MRN, date of birth, ID band Patient awake    Reviewed: Allergy & Precautions, H&P , NPO status , Patient's Chart, lab work & pertinent test results, reviewed documented beta blocker date and time   Airway Mallampati: II  TM Distance: >3 FB Neck ROM: full    Dental no notable dental hx.    Pulmonary sleep apnea (possible) ,    Pulmonary exam normal breath sounds clear to auscultation       Cardiovascular Exercise Tolerance: Good hypertension,  Rhythm:regular Rate:Normal     Neuro/Psych negative neurological ROS  negative psych ROS   GI/Hepatic Neg liver ROS, GERD  ,  Endo/Other  diabetes (borderline)  Renal/GU negative Renal ROS  negative genitourinary   Musculoskeletal   Abdominal   Peds  Hematology negative hematology ROS (+)   Anesthesia Other Findings   Reproductive/Obstetrics negative OB ROS                             Anesthesia Physical Anesthesia Plan  ASA: II  Anesthesia Plan: MAC   Post-op Pain Management:    Induction:   Airway Management Planned:   Additional Equipment:   Intra-op Plan:   Post-operative Plan:   Informed Consent: I have reviewed the patients History and Physical, chart, labs and discussed the procedure including the risks, benefits and alternatives for the proposed anesthesia with the patient or authorized representative who has indicated his/her understanding and acceptance.   Dental Advisory Given  Plan Discussed with: CRNA  Anesthesia Plan Comments:         Anesthesia Quick Evaluation

## 2015-12-16 NOTE — Anesthesia Procedure Notes (Signed)
Procedure Name: MAC Performed by: Burnadette Baskett Pre-anesthesia Checklist: Patient identified, Emergency Drugs available, Suction available, Patient being monitored and Timeout performed Patient Re-evaluated:Patient Re-evaluated prior to inductionOxygen Delivery Method: Nasal cannula       

## 2015-12-16 NOTE — Op Note (Signed)
[facility] Patient Name: Maria Mcbride Procedure Date: 12/16/2015 8:48 AM MRN: [id number] Account #: [id number] Date of Birth: 1976/04/01 Admit Type: Outpatient Age: 40 Room: [facility] OR ROOM 01 Gender: Female Note Status: Finalized Procedure:            Colonoscopy Indications:          Follow-up of diverticulitis Providers:            Midge Minium, MD Referring MD:         Doreene Nest (Referring MD) Medicines:            Propofol per Anesthesia Complications:        No immediate complications. Procedure:            Pre-Anesthesia Assessment:                       - Prior to the procedure, a History and Physical was                        performed, and patient medications and allergies were                        reviewed. The patient's tolerance of previous                        anesthesia was also reviewed. The risks and benefits of                        the procedure and the sedation options and risks were                        discussed with the patient. All questions were                        answered, and informed consent was obtained. Prior                        Anticoagulants: The patient has taken no previous                        anticoagulant or antiplatelet agents. ASA Grade                        Assessment: II - A patient with mild systemic disease.                        After reviewing the risks and benefits, the patient was                        deemed in satisfactory condition to undergo the                        procedure.                       After obtaining informed consent, the colonoscope was                        passed under direct vision. Throughout the procedure,  the patient's blood pressure, pulse, and oxygen                        saturations were monitored continuously. The Olympus CF                        H180AL colonoscope (S#: [id number]) was introduced through       the anus and advanced to the the cecum, identified by                        appendiceal orifice and ileocecal valve. The                        colonoscopy was performed without difficulty. The                        patient tolerated the procedure well. The quality of                        the bowel preparation was excellent. Findings:      The perianal and digital rectal examinations were normal.      A few small-mouthed diverticula were found in the sigmoid colon.      Non-bleeding internal hemorrhoids were found during retroflexion. The       hemorrhoids were Grade II (internal hemorrhoids that prolapse but reduce       spontaneously). Impression:           - Diverticulosis in the sigmoid colon.                       - Non-bleeding internal hemorrhoids.                       - No specimens collected. Recommendation:       - Await pathology results. Procedure Code(s):    --- Professional ---                       [phone], Colonoscopy, flexible; diagnostic, including                        collection of specimen(s) by brushing or washing, when                        performed (separate procedure) Diagnosis Code(s):    --- Professional ---                       U04.54, Diverticulitis of large intestine without                        perforation or abscess without bleeding                       K64.1, Second degree hemorrhoids                       K57.30, Diverticulosis of large intestine without                        perforation or abscess without bleeding CPT copyright 2016 American Medical Association. All rights reserved. The codes documented in this report are preliminary  and upon coder review may  be revised to meet current compliance requirements. Midge Miniumarren Pollie Poma, MD 12/16/2015 9:10:17 AM This report has been signed electronically. Number of Addenda: 0 Note Initiated On: 12/16/2015 8:48 AM Scope Withdrawal Time: 0 hours 6 minutes 8 seconds  Total Procedure Duration: 0 hours 8  minutes 47 seconds       [facility]

## 2015-12-17 ENCOUNTER — Encounter: Payer: Self-pay | Admitting: Gastroenterology

## 2015-12-21 ENCOUNTER — Telehealth: Payer: Self-pay | Admitting: Surgery

## 2015-12-21 NOTE — Telephone Encounter (Signed)
!  left message for patient! she needs to change appointment date/time! she is currently scheduled to see Dr Everlene FarrierPabon on Wednesday 4/12 -- due to scheduling conflict, Dr Everlene FarrierPabon will not be in the office the afternoon of 4/12   Patient needs to reschedule to see Dr Everlene FarrierPabon and [facility]/24, Tuesday 4/25, Wednesday 4/26 or Thursday 4/27.

## 2015-12-22 ENCOUNTER — Ambulatory Visit: Payer: Self-pay | Admitting: Surgery

## 2015-12-23 ENCOUNTER — Other Ambulatory Visit: Payer: Self-pay

## 2015-12-23 ENCOUNTER — Encounter: Payer: Self-pay | Admitting: Surgery

## 2015-12-23 ENCOUNTER — Ambulatory Visit (INDEPENDENT_AMBULATORY_CARE_PROVIDER_SITE_OTHER): Payer: BLUE CROSS/BLUE SHIELD | Admitting: Surgery

## 2015-12-23 ENCOUNTER — Telehealth: Payer: Self-pay

## 2015-12-23 VITALS — BP 118/81 | HR 76 | Temp 98.5°F | Ht 62.0 in | Wt 187.0 lb

## 2015-12-23 DIAGNOSIS — K5732 Diverticulitis of large intestine without perforation or abscess without bleeding: Secondary | ICD-10-CM | POA: Diagnosis not present

## 2015-12-23 DIAGNOSIS — J302 Other seasonal allergic rhinitis: Secondary | ICD-10-CM

## 2015-12-23 MED ORDER — METRONIDAZOLE 500 MG PO TABS: 500.0000 mg | ORAL_TABLET | Freq: Three times a day (TID) | ORAL | Status: DC

## 2015-12-23 MED ORDER — POLYETHYLENE GLYCOL 3350 17 GM/SCOOP PO POWD: 1.0000 | Freq: Once | ORAL | Status: DC

## 2015-12-23 MED ORDER — NEOMYCIN SULFATE 500 MG PO TABS: 1000.0000 mg | ORAL_TABLET | Freq: Three times a day (TID) | ORAL | Status: DC

## 2015-12-23 MED ORDER — BISACODYL EC 5 MG PO TBEC: DELAYED_RELEASE_TABLET | ORAL | Status: DC

## 2015-12-23 MED ORDER — OLOPATADINE HCL 0.1 % OP SOLN: 1.0000 [drp] | Freq: Two times a day (BID) | OPHTHALMIC | Status: DC

## 2015-12-23 NOTE — Telephone Encounter (Signed)
RX sent to pharmacy. Please notify patient. Thanks.

## 2015-12-23 NOTE — Telephone Encounter (Signed)
Pt left v/m; [facility] [address] pt has used Pataday opthalmic solution in the past(Kate Chestine Sporelark NP has never prescribed) and pt wants to know if Jae DireKate would send rx for pataday to [facility] [address]. Pt having runny, itchy, burning eyes due to allergies. Pt request cb. Pt last seen 11/24/15.

## 2015-12-23 NOTE — Telephone Encounter (Signed)
Pt notified as instructed and pt voiced understanding. 

## 2015-12-23 NOTE — Progress Notes (Signed)
Maria Mcbride is following up for complicated diverticulitis with abscess. She now has recovered from her diverticulitis and had a recent colonoscopy showing no evidence of malignancy. Currently she is symptom free and is rated to have her colectomy scheduled   PE NAD in good spitrits Abd Soft, NT.  A/P complicated diverticulitis with abscess in need for sigmoid colectomy. Discussed with the patient in detail about the procedure. As his laparoscopic sigmoid colectomy possible open possible ostomy. Discussed with patient in detail about the procedure and the expected postoperative course. Risks benefits and possible complications including but not limited to: Bleeding, infection, anastomotic leak, need for colostomy and possible re-interventions. She understands and wishes to proceed. We will schedule her for an elective laparoscopic sigmoid colectomy. All questions were answered, total time spent is encounter was 25 minutes with the majority of time being allocated to counseling the patient about the surgery

## 2015-12-27 ENCOUNTER — Telehealth: Payer: Self-pay | Admitting: Surgery

## 2015-12-27 NOTE — Telephone Encounter (Signed)
Pt advised of pre op date/time and sx date. Sx: 01/05/16 with Dr Pabon--Dr Excell Seltzerooper assisting. --Laparoscopic sigmoid colectomy.  Pre op: 12/28/15 @ 10:30am--Phone.   Patient made aware to call [phone], between 1-3:00pm the day before surgery, to find out what time to arrive.

## 2015-12-28 ENCOUNTER — Encounter
Admission: RE | Admit: 2015-12-28 | Discharge: 2015-12-28 | Disposition: A | Discharge status: Home / Self Care | Payer: BLUE CROSS/BLUE SHIELD | Source: Ambulatory Visit | Attending: Surgery | Admitting: Surgery

## 2015-12-28 DIAGNOSIS — I10 Essential (primary) hypertension: Secondary | ICD-10-CM | POA: Diagnosis not present

## 2015-12-28 DIAGNOSIS — Z01812 Encounter for preprocedural laboratory examination: Secondary | ICD-10-CM | POA: Diagnosis present

## 2015-12-28 DIAGNOSIS — Z01810 Encounter for preprocedural cardiovascular examination: Secondary | ICD-10-CM | POA: Diagnosis present

## 2015-12-28 LAB — BASIC METABOLIC PANEL
ANION GAP: 9 (ref 5–15)
BUN: 10 mg/dL (ref 6–20)
CALCIUM: 9.3 mg/dL (ref 8.9–10.3)
CO2: 28 mmol/L (ref 22–32)
Chloride: 100 mmol/L — ABNORMAL LOW (ref 101–111)
Creatinine, Ser: 0.78 mg/dL (ref 0.44–1.00)
Glucose, Bld: 164 mg/dL — ABNORMAL HIGH (ref 65–99)
Potassium: 3.4 mmol/L — ABNORMAL LOW (ref 3.5–5.1)
SODIUM: 137 mmol/L (ref 135–145)

## 2015-12-28 LAB — SURGICAL PCR SCREEN
MRSA, PCR: NEGATIVE
STAPHYLOCOCCUS AUREUS: NEGATIVE

## 2015-12-28 NOTE — Patient Instructions (Signed)
  Your procedure is scheduled on: 01/05/16 WED Report to Same Day Surgery 2nd floor medical mall To find out your arrival time please call [phone] between 1PM - 3PM on  April 25/17 Tues  Remember: Instructions that are not followed completely may result in serious medical risk, up to and including death, or upon the discretion of your surgeon and anesthesiologist your surgery may need to be rescheduled.    _x___ 1. Do not eat food or drink liquids after midnight. No gum chewing or hard candies.     __x__ 2. No Alcohol for 24 hours before or after surgery.   ____ 3. Bring all medications with you on the day of surgery if instructed.    __x__ 4. Notify your doctor if there is any change in your medical condition     (cold, fever, infections).     Do not wear jewelry, make-up, hairpins, clips or nail polish.  Do not wear lotions, powders, or perfumes. You may wear deodorant.  Do not shave 48 hours prior to surgery. Men may shave face and neck.  Do not bring valuables to the hospital.    [facility] is not responsible for any belongings or valuables.               Contacts, dentures or bridgework may not be worn into surgery.  Leave your suitcase in the car. After surgery it may be brought to your room.  For patients admitted to the hospital, discharge time is determined by your treatment team.   Patients discharged the day of surgery will not be allowed to drive home.    Please read over the following fact sheets that you were given:   [facility] and or MRSA Information   _x___ Take these medicines the morning of surgery with A SIP OF WATER:    1. As directed by surgeon  2.  3.  4.  5.  6.  ____ Fleet Enema (as directed)   _x___ Use CHG Soap or sage wipes as directed on instruction sheet   ____ Use inhalers on the day of surgery and bring to hospital day of surgery  __x__ Stop metformin 2 days prior to surgery    ____ Take 1/2 of usual insulin  dose the night before surgery and none on the morning of surgery.   ____ Stop aspirin or coumadin, or plavix  _x__ Stop Anti-inflammatories such as Advil, Aleve, Ibuprofen, Motrin, Naproxen,          Naprosyn, Goodies powders or aspirin products. Ok to take Tylenol.   ____ Stop supplements until after surgery.    ____ Bring C-Pap to the hospital.

## 2015-12-29 ENCOUNTER — Telehealth: Payer: Self-pay

## 2015-12-29 MED ORDER — ERYTHROMYCIN BASE 500 MG PO TABS: 1000.0000 mg | ORAL_TABLET | Freq: Three times a day (TID) | ORAL | Status: DC

## 2015-12-29 NOTE — Telephone Encounter (Signed)
Patient's antibiotic bowel prep has been prescribed and sent to pharmacy at this time. Patient has been sent a message in my chart with instructions on how to take these medications.  Called patient as well and left VM with the above information. Asked for return phone call if there are any questions.

## 2016-01-03 ENCOUNTER — Telehealth: Payer: Self-pay | Admitting: Surgery

## 2016-01-03 NOTE — Telephone Encounter (Signed)
Patient has a laparoscopic colon resection scheduled with Dr Everlene FarrierPabon on 01/05/16. She has questions about the prescriptions that were given to her to take prior to her surgery. Please call patient to discuss this.

## 2016-01-03 NOTE — Telephone Encounter (Signed)
Called patient back  and left her a voicemail. 

## 2016-01-03 NOTE — Telephone Encounter (Signed)
Patient called in and is questioning the 3 antibiotics that she has been ordered as part of her bowel prep. I explained that I would have to clarify this with Dr. Everlene FarrierPabon first thing in the morning and call her with instructions. She verbalizes understanding and will wait to hear from me before beginning bowel prep.

## 2016-01-04 NOTE — Telephone Encounter (Signed)
Clarified that patient is to take the Neomycin and Erythromycin with bowel prep and this was explained to the patient this morning. She will call back with any further questions.

## 2016-01-05 ENCOUNTER — Encounter: Payer: Self-pay | Admitting: Anesthesiology

## 2016-01-05 ENCOUNTER — Inpatient Hospital Stay: Payer: BLUE CROSS/BLUE SHIELD | Admitting: Anesthesiology

## 2016-01-05 ENCOUNTER — Inpatient Hospital Stay
Admission: RE | Admit: 2016-01-05 | Discharge: 2016-01-09 | DRG: 331 | Disposition: A | Discharge status: Home / Self Care | Payer: BLUE CROSS/BLUE SHIELD | Source: Ambulatory Visit | Attending: Surgery | Admitting: Surgery

## 2016-01-05 ENCOUNTER — Encounter
Admission: RE | Disposition: A | Discharge status: Home / Self Care | Payer: Self-pay | Source: Ambulatory Visit | Attending: Surgery

## 2016-01-05 DIAGNOSIS — K660 Peritoneal adhesions (postprocedural) (postinfection): Secondary | ICD-10-CM | POA: Diagnosis present

## 2016-01-05 DIAGNOSIS — R0602 Shortness of breath: Secondary | ICD-10-CM

## 2016-01-05 DIAGNOSIS — Z833 Family history of diabetes mellitus: Secondary | ICD-10-CM | POA: Diagnosis not present

## 2016-01-05 DIAGNOSIS — K5732 Diverticulitis of large intestine without perforation or abscess without bleeding: Secondary | ICD-10-CM

## 2016-01-05 DIAGNOSIS — G4730 Sleep apnea, unspecified: Secondary | ICD-10-CM | POA: Diagnosis present

## 2016-01-05 DIAGNOSIS — Z8701 Personal history of pneumonia (recurrent): Secondary | ICD-10-CM

## 2016-01-05 DIAGNOSIS — Z79899 Other long term (current) drug therapy: Secondary | ICD-10-CM | POA: Diagnosis not present

## 2016-01-05 DIAGNOSIS — K5720 Diverticulitis of large intestine with perforation and abscess without bleeding: Principal | ICD-10-CM | POA: Diagnosis present

## 2016-01-05 DIAGNOSIS — Z9889 Other specified postprocedural states: Secondary | ICD-10-CM

## 2016-01-05 DIAGNOSIS — I10 Essential (primary) hypertension: Secondary | ICD-10-CM | POA: Diagnosis present

## 2016-01-05 DIAGNOSIS — Z9049 Acquired absence of other specified parts of digestive tract: Secondary | ICD-10-CM

## 2016-01-05 DIAGNOSIS — K219 Gastro-esophageal reflux disease without esophagitis: Secondary | ICD-10-CM | POA: Diagnosis present

## 2016-01-05 HISTORY — PX: COLON RESECTION: SHX5231

## 2016-01-05 HISTORY — DX: Diverticulitis of large intestine without perforation or abscess without bleeding: K57.32

## 2016-01-05 LAB — POCT PREGNANCY, URINE
PREG TEST UR: NEGATIVE
Preg Test, Ur: NEGATIVE

## 2016-01-05 LAB — CREATININE, SERUM
CREATININE: 0.91 mg/dL (ref 0.44–1.00)
GFR calc Af Amer: >60 mL/min (ref >60)
GFR calc non Af Amer: >60 mL/min (ref >60)

## 2016-01-05 LAB — CBC
HCT: 36.4 % (ref 35.0–47.0)
HEMOGLOBIN: 12.2 g/dL (ref 12.0–16.0)
MCH: 28.3 pg (ref 26.0–34.0)
MCHC: 33.5 g/dL (ref 32.0–36.0)
MCV: 84.5 fL (ref 80.0–100.0)
Platelets: 217 10*3/uL (ref 150–440)
RBC: 4.31 MIL/uL (ref 3.80–5.20)
RDW: 16.6 % — ABNORMAL HIGH (ref 11.5–14.5)
WBC: 16.4 10*3/uL — ABNORMAL HIGH (ref 3.6–11.0)

## 2016-01-05 LAB — POCT CBG MONITORING: POCT Glucose (KUC): 124 mg/dL — AB (ref 70–99)

## 2016-01-05 LAB — GLUCOSE, CAPILLARY
GLUCOSE-CAPILLARY: 124 mg/dL — AB (ref 65–99)
GLUCOSE-CAPILLARY: 149 mg/dL — AB (ref 65–99)
GLUCOSE-CAPILLARY: 170 mg/dL — AB (ref 65–99)
Glucose-Capillary: 147 mg/dL — ABNORMAL HIGH (ref 65–99)

## 2016-01-05 SURGERY — COLECTOMY, HAND-ASSISTED, LAPAROSCOPIC
Anesthesia: General

## 2016-01-05 MED ORDER — SODIUM CHLORIDE 0.9 % IJ SOLN
INTRAMUSCULAR | Status: AC
  Filled 2016-01-05: qty 50

## 2016-01-05 MED ORDER — FENTANYL CITRATE (PF) 100 MCG/2ML IJ SOLN
INTRAMUSCULAR | Status: DC | PRN
  Administered 2016-01-05 (×2): 50 ug via INTRAVENOUS
  Administered 2016-01-05: 100 ug via INTRAVENOUS
  Administered 2016-01-05: 50 ug via INTRAVENOUS

## 2016-01-05 MED ORDER — FENTANYL CITRATE (PF) 100 MCG/2ML IJ SOLN
INTRAMUSCULAR | Status: AC
  Administered 2016-01-05: 25 ug via INTRAVENOUS
  Filled 2016-01-05: qty 2

## 2016-01-05 MED ORDER — CHLORHEXIDINE GLUCONATE 4 % EX LIQD: 1.0000 "application " | Freq: Once | CUTANEOUS | Status: DC

## 2016-01-05 MED ORDER — BUPIVACAINE-EPINEPHRINE (PF) 0.5% -1:200000 IJ SOLN
INTRAMUSCULAR | Status: AC
  Filled 2016-01-05: qty 30

## 2016-01-05 MED ORDER — INSULIN ASPART 100 UNIT/ML ~~LOC~~ SOLN
0.0000 [IU] | Freq: Three times a day (TID) | SUBCUTANEOUS | Status: DC
  Administered 2016-01-05: 1 [IU] via SUBCUTANEOUS
  Filled 2016-01-05: qty 1

## 2016-01-05 MED ORDER — KETOROLAC TROMETHAMINE 30 MG/ML IJ SOLN
30.0000 mg | Freq: Four times a day (QID) | INTRAMUSCULAR | Status: AC
  Administered 2016-01-05 – 2016-01-08 (×12): 30 mg via INTRAVENOUS
  Filled 2016-01-05 (×12): qty 1

## 2016-01-05 MED ORDER — DIPHENHYDRAMINE HCL 12.5 MG/5ML PO ELIX: 12.5000 mg | ORAL_SOLUTION | Freq: Four times a day (QID) | ORAL | Status: DC | PRN

## 2016-01-05 MED ORDER — ACETAMINOPHEN 10 MG/ML IV SOLN
INTRAVENOUS | Status: AC
  Filled 2016-01-05: qty 100

## 2016-01-05 MED ORDER — BUPIVACAINE-EPINEPHRINE (PF) 0.5% -1:200000 IJ SOLN
INTRAMUSCULAR | Status: DC | PRN
  Administered 2016-01-05: 30 mL via PERINEURAL

## 2016-01-05 MED ORDER — LACTATED RINGERS IV SOLN
INTRAVENOUS | Status: DC | PRN
  Administered 2016-01-05: 13:00:00 via INTRAVENOUS

## 2016-01-05 MED ORDER — SODIUM CHLORIDE 0.9 % IV SOLN
1.0000 g | INTRAVENOUS | Status: AC
  Administered 2016-01-05: 1 g via INTRAVENOUS
  Filled 2016-01-05: qty 1

## 2016-01-05 MED ORDER — ONDANSETRON HCL 4 MG/2ML IJ SOLN: 4.0000 mg | Freq: Four times a day (QID) | INTRAMUSCULAR | Status: DC | PRN

## 2016-01-05 MED ORDER — DEXAMETHASONE SODIUM PHOSPHATE 10 MG/ML IJ SOLN
INTRAMUSCULAR | Status: DC | PRN
  Administered 2016-01-05: 5 mg via INTRAVENOUS

## 2016-01-05 MED ORDER — NEOSTIGMINE METHYLSULFATE 10 MG/10ML IV SOLN
INTRAVENOUS | Status: DC | PRN
  Administered 2016-01-05: 2 mg via INTRAVENOUS

## 2016-01-05 MED ORDER — ACETAMINOPHEN 10 MG/ML IV SOLN
INTRAVENOUS | Status: DC | PRN
  Administered 2016-01-05: 1000 mg via INTRAVENOUS

## 2016-01-05 MED ORDER — SUCCINYLCHOLINE CHLORIDE 20 MG/ML IJ SOLN
INTRAMUSCULAR | Status: DC | PRN
  Administered 2016-01-05: 100 mg via INTRAVENOUS

## 2016-01-05 MED ORDER — DIPHENHYDRAMINE HCL 50 MG/ML IJ SOLN: 12.5000 mg | Freq: Four times a day (QID) | INTRAMUSCULAR | Status: DC | PRN

## 2016-01-05 MED ORDER — LIDOCAINE HCL (CARDIAC) 20 MG/ML IV SOLN
INTRAVENOUS | Status: DC | PRN
  Administered 2016-01-05: 80 mg via INTRAVENOUS

## 2016-01-05 MED ORDER — BUPIVACAINE LIPOSOME 1.3 % IJ SUSP
INTRAMUSCULAR | Status: AC
  Filled 2016-01-05: qty 20

## 2016-01-05 MED ORDER — OXYCODONE HCL 5 MG PO TABS
10.0000 mg | ORAL_TABLET | ORAL | Status: DC | PRN
Start: 2016-01-05 — End: 2016-01-06
  Administered 2016-01-05 (×2): 10 mg via ORAL
  Filled 2016-01-05 (×2): qty 2

## 2016-01-05 MED ORDER — ROCURONIUM BROMIDE 100 MG/10ML IV SOLN
INTRAVENOUS | Status: DC | PRN
  Administered 2016-01-05: 15 mg via INTRAVENOUS
  Administered 2016-01-05: 10 mg via INTRAVENOUS
  Administered 2016-01-05: 15 mg via INTRAVENOUS
  Administered 2016-01-05: 10 mg via INTRAVENOUS
  Administered 2016-01-05: 20 mg via INTRAVENOUS

## 2016-01-05 MED ORDER — PHENYLEPHRINE HCL 10 MG/ML IJ SOLN
INTRAMUSCULAR | Status: DC | PRN
  Administered 2016-01-05 (×3): 100 ug via INTRAVENOUS

## 2016-01-05 MED ORDER — MIDAZOLAM HCL 2 MG/2ML IJ SOLN
INTRAMUSCULAR | Status: DC | PRN
  Administered 2016-01-05: 2 mg via INTRAVENOUS

## 2016-01-05 MED ORDER — LACTATED RINGERS IV SOLN
INTRAVENOUS | Status: DC
  Administered 2016-01-05 – 2016-01-08 (×4): via INTRAVENOUS

## 2016-01-05 MED ORDER — KETOROLAC TROMETHAMINE 30 MG/ML IJ SOLN
INTRAMUSCULAR | Status: DC | PRN
Start: 2016-01-05 — End: 2016-01-05
  Administered 2016-01-05: 15 mg via INTRAVENOUS

## 2016-01-05 MED ORDER — MORPHINE SULFATE (PF) 2 MG/ML IV SOLN: 2.0000 mg | INTRAVENOUS | Status: DC | PRN

## 2016-01-05 MED ORDER — SODIUM CHLORIDE 0.9 % IV SOLN
INTRAVENOUS | Status: DC
  Administered 2016-01-05 (×2): via INTRAVENOUS

## 2016-01-05 MED ORDER — HEPARIN SODIUM (PORCINE) 5000 UNIT/ML IJ SOLN
INTRAMUSCULAR | Status: AC
  Administered 2016-01-05: 5000 [IU] via SUBCUTANEOUS
  Filled 2016-01-05: qty 1

## 2016-01-05 MED ORDER — ACETAMINOPHEN 500 MG PO TABS
1000.0000 mg | ORAL_TABLET | Freq: Four times a day (QID) | ORAL | Status: AC
  Administered 2016-01-05 – 2016-01-06 (×4): 1000 mg via ORAL
  Filled 2016-01-05 (×4): qty 2

## 2016-01-05 MED ORDER — ONDANSETRON HCL 4 MG/2ML IJ SOLN
INTRAMUSCULAR | Status: DC | PRN
  Administered 2016-01-05: 4 mg via INTRAVENOUS

## 2016-01-05 MED ORDER — BUPIVACAINE LIPOSOME 1.3 % IJ SUSP
INTRAMUSCULAR | Status: DC | PRN
  Administered 2016-01-05: 100 mL

## 2016-01-05 MED ORDER — ONDANSETRON HCL 4 MG PO TABS: 4.0000 mg | ORAL_TABLET | Freq: Four times a day (QID) | ORAL | Status: DC | PRN

## 2016-01-05 MED ORDER — ONDANSETRON HCL 4 MG/2ML IJ SOLN: 4.0000 mg | Freq: Once | INTRAMUSCULAR | Status: DC | PRN

## 2016-01-05 MED ORDER — GLYCOPYRROLATE 0.2 MG/ML IJ SOLN
INTRAMUSCULAR | Status: DC | PRN
  Administered 2016-01-05: 0.4 mg via INTRAVENOUS

## 2016-01-05 MED ORDER — ENOXAPARIN SODIUM 40 MG/0.4ML ~~LOC~~ SOLN
40.0000 mg | SUBCUTANEOUS | Status: DC
  Administered 2016-01-06 – 2016-01-08 (×3): 40 mg via SUBCUTANEOUS
  Filled 2016-01-05 (×3): qty 0.4

## 2016-01-05 MED ORDER — HEPARIN SODIUM (PORCINE) 5000 UNIT/ML IJ SOLN
5000.0000 [IU] | Freq: Once | INTRAMUSCULAR | Status: AC
  Administered 2016-01-05: 5000 [IU] via SUBCUTANEOUS

## 2016-01-05 MED ORDER — INSULIN ASPART 100 UNIT/ML ~~LOC~~ SOLN
4.0000 [IU] | Freq: Three times a day (TID) | SUBCUTANEOUS | Status: DC
  Administered 2016-01-05 – 2016-01-09 (×3): 4 [IU] via SUBCUTANEOUS
  Filled 2016-01-05 (×3): qty 4

## 2016-01-05 MED ORDER — FENTANYL CITRATE (PF) 100 MCG/2ML IJ SOLN
25.0000 ug | INTRAMUSCULAR | Status: DC | PRN
  Administered 2016-01-05 (×4): 25 ug via INTRAVENOUS

## 2016-01-05 MED ORDER — METOPROLOL TARTRATE 5 MG/5ML IV SOLN
5.0000 mg | Freq: Four times a day (QID) | INTRAVENOUS | Status: DC
  Filled 2016-01-05: qty 5

## 2016-01-05 MED ORDER — FAMOTIDINE 20 MG PO TABS
ORAL_TABLET | ORAL | Status: AC
  Administered 2016-01-05: 20 mg via ORAL
  Filled 2016-01-05: qty 1

## 2016-01-05 MED ORDER — FAMOTIDINE 20 MG PO TABS
20.0000 mg | ORAL_TABLET | Freq: Once | ORAL | Status: AC
  Administered 2016-01-05: 20 mg via ORAL

## 2016-01-05 SURGICAL SUPPLY — 55 items
BLADE SURG SZ10 CARB STEEL (BLADE) ×3 IMPLANT
CANISTER SUCT 1200ML W/VALVE (MISCELLANEOUS) ×3 IMPLANT
DECANTER SPIKE VIAL GLASS SM (MISCELLANEOUS) ×3 IMPLANT
DEFOGGER SCOPE WARMER CLEARIFY (MISCELLANEOUS) ×3 IMPLANT
DERMABOND ADVANCED (GAUZE/BANDAGES/DRESSINGS) ×2
DERMABOND ADVANCED .7 DNX12 (GAUZE/BANDAGES/DRESSINGS) ×1 IMPLANT
DRAPE INCISE IOBAN 66X45 STRL (DRAPES) IMPLANT
ELECT CAUTERY BLADE 6.4 (BLADE) ×6 IMPLANT
GELPORT LAPAROSCOPIC (MISCELLANEOUS) ×3 IMPLANT
GLOVE BIO SURGEON STRL SZ7 (GLOVE) ×6 IMPLANT
GOWN STRL REUS W/ TWL LRG LVL3 (GOWN DISPOSABLE) ×4 IMPLANT
GOWN STRL REUS W/TWL LRG LVL3 (GOWN DISPOSABLE) ×8
HANDLE SUCTION POOLE (INSTRUMENTS) ×1 IMPLANT
HANDLE YANKAUER SUCT BULB TIP (MISCELLANEOUS) ×3 IMPLANT
IRRIGATION STRYKERFLOW (MISCELLANEOUS) ×1 IMPLANT
IRRIGATOR STRYKERFLOW (MISCELLANEOUS) ×3
LIQUID BAND (GAUZE/BANDAGES/DRESSINGS) ×6 IMPLANT
NEEDLE HYPO 22GX1.5 SAFETY (NEEDLE) ×3 IMPLANT
NS IRRIG 1000ML POUR BTL (IV SOLUTION) ×3 IMPLANT
PACK COLON CLEAN CLOSURE (MISCELLANEOUS) ×3 IMPLANT
PACK LAP CHOLECYSTECTOMY (MISCELLANEOUS) ×3 IMPLANT
PAD GROUND ADULT SPLIT (MISCELLANEOUS) ×3 IMPLANT
PENCIL ELECTRO HAND CTR (MISCELLANEOUS) ×6 IMPLANT
RELOAD BLUE (STAPLE) ×6 IMPLANT
RELOAD STAPLER BLUE 60MM (STAPLE) ×3 IMPLANT
RELOAD STAPLER WHITE 60MM (STAPLE) ×3 IMPLANT
RETRACTOR WOUND ALXS 18CM MED (MISCELLANEOUS) ×1 IMPLANT
RTRCTR WOUND ALEXIS O 18CM MED (MISCELLANEOUS) ×3
SHEARS HARMONIC ACE PLUS 36CM (ENDOMECHANICALS) ×3 IMPLANT
SLEEVE ENDOPATH XCEL 5M (ENDOMECHANICALS) IMPLANT
SPONGE LAP 18X18 5 PK (GAUZE/BANDAGES/DRESSINGS) ×9 IMPLANT
STAPLE ECHEON FLEX 60 POW ENDO (STAPLE) ×3 IMPLANT
STAPLER CIRCULAR 29MM (STAPLE) ×3 IMPLANT
STAPLER RELOAD BLUE 60MM (STAPLE) ×9
STAPLER RELOAD WHITE 60MM (STAPLE) ×9
SUCTION POOLE HANDLE (INSTRUMENTS) ×3
SUT MNCRL 4-0 (SUTURE) ×4
SUT MNCRL 4-0 27XMFL (SUTURE) ×2
SUT PDS 2-0 27IN (SUTURE) ×6 IMPLANT
SUT PDS AB 0 CT1 27 (SUTURE) ×6 IMPLANT
SUT SILK 2 0 (SUTURE) ×2
SUT SILK 2 0 SH (SUTURE) ×3 IMPLANT
SUT SILK 2 0 SH CR/8 (SUTURE) ×3 IMPLANT
SUT SILK 2-0 30XBRD TIE 12 (SUTURE) ×1 IMPLANT
SUT VIC AB 2-0 SH 27 (SUTURE) ×2
SUT VIC AB 2-0 SH 27XBRD (SUTURE) ×1 IMPLANT
SUTURE MNCRL 4-0 27XMF (SUTURE) ×2 IMPLANT
SYR 30ML LL (SYRINGE) ×3 IMPLANT
SYRINGE IRR TOOMEY STRL 70CC (SYRINGE) ×3 IMPLANT
TOWEL OR 17X26 4PK STRL BLUE (TOWEL DISPOSABLE) ×3 IMPLANT
TROCAR XCEL BLUNT TIP 100MML (ENDOMECHANICALS) ×3 IMPLANT
TROCAR XCEL NON-BLD 11X100MML (ENDOMECHANICALS) ×3 IMPLANT
TROCAR XCEL NON-BLD 5MMX100MML (ENDOMECHANICALS) ×3 IMPLANT
TUBING INSUF HEATED (TUBING) ×3 IMPLANT
TUBING INSUFFLATOR HEATED (MISCELLANEOUS) ×3 IMPLANT

## 2016-01-05 NOTE — Transfer of Care (Signed)
Immediate Anesthesia Transfer of Care Note  Patient: Maria Mcbride  Procedure(s) Performed: Procedure(s): HAND ASSISTED LAPAROSCOPIC COLON RESECTION (N/A)  Patient Location: PACU  Anesthesia Type:General  Level of Consciousness: awake  Airway & Oxygen Therapy: Patient Spontanous Breathing  Post-op Assessment: Report given to RN  Post vital signs: stable  Last Vitals:  Filed Vitals:   01/05/16 0955 01/05/16 1445  BP:    Pulse:    Temp: 36.9 C 36.8 C  Resp:      Last Pain: There were no vitals filed for this visit.       Complications: No apparent anesthesia complications

## 2016-01-05 NOTE — Anesthesia Postprocedure Evaluation (Signed)
Anesthesia Post Note  Patient: Maria Mcbride  Procedure(s) Performed: Procedure(s) (LRB): HAND ASSISTED LAPAROSCOPIC COLON RESECTION (N/A)  Patient location during evaluation: PACU Anesthesia Type: General Level of consciousness: awake and alert Pain management: pain level controlled Vital Signs Assessment: post-procedure vital signs reviewed and stable Respiratory status: spontaneous breathing, nonlabored ventilation, respiratory function stable and patient connected to nasal cannula oxygen Cardiovascular status: blood pressure returned to baseline and stable Postop Assessment: no signs of nausea or vomiting Anesthetic complications: no    Last Vitals:  Filed Vitals:   01/05/16 1635 01/05/16 1731  BP: 118/76 118/76  Pulse: 89 92  Temp: 36.8 C 36.9 C  Resp: 16 16    Last Pain:  Filed Vitals:   01/05/16 1822  PainSc: 2                  [name] S

## 2016-01-05 NOTE — Anesthesia Preprocedure Evaluation (Addendum)
Anesthesia Evaluation  Patient identified by MRN, date of birth, ID band Patient awake    Reviewed: Allergy & Precautions, NPO status , Patient's Chart, lab work & pertinent test results, reviewed documented beta blocker date and time   Airway Mallampati: III  TM Distance: >3 FB     Dental  (+) Chipped   Pulmonary sleep apnea , pneumonia, resolved,           Cardiovascular hypertension, Pt. on medications      Neuro/Psych    GI/Hepatic GERD  ,  Endo/Other  diabetes, Type 2  Renal/GU      Musculoskeletal   Abdominal   Peds  Hematology   Anesthesia Other Findings   Reproductive/Obstetrics                            Anesthesia Physical Anesthesia Plan  ASA: III  Anesthesia Plan: General   Post-op Pain Management:    Induction: Intravenous  Airway Management Planned: Oral ETT  Additional Equipment:   Intra-op Plan:   Post-operative Plan:   Informed Consent: I have reviewed the patients History and Physical, chart, labs and discussed the procedure including the risks, benefits and alternatives for the proposed anesthesia with the patient or authorized representative who has indicated his/her understanding and acceptance.     Plan Discussed with: CRNA  Anesthesia Plan Comments:         Anesthesia Quick Evaluation

## 2016-01-05 NOTE — Anesthesia Procedure Notes (Signed)
Procedure Name: Intubation Date/Time: 01/05/2016 10:23 AM Performed by: Jannet MantisPACE, Catheleen Langhorne Pre-anesthesia Checklist: Patient identified, Emergency Drugs available, Suction available and Patient being monitored Patient Re-evaluated:Patient Re-evaluated prior to inductionOxygen Delivery Method: Circle system utilized Preoxygenation: Pre-oxygenation with 100% oxygen Intubation Type: IV induction Ventilation: Mask ventilation without difficulty Laryngoscope Size: Mac and 3 Grade View: Grade I Tube type: Oral Tube size: 7.0 mm Number of attempts: 1 Placement Confirmation: ETT inserted through vocal cords under direct vision Secured at: 21 cm Tube secured with: Tape Dental Injury: Teeth and Oropharynx as per pre-operative assessment

## 2016-01-05 NOTE — Op Note (Signed)
PROCEDURES: 1. Laparoscopic hand-assisted sigmoid colectomy 2. Laparoscopic lysis of adhesions 3. Omental flap 4. Mobilization of the splenic flexure 5. placement of Blake drain in the pelvis  Pre-operative Diagnosis: Complicated Diverticulitis  Post-operative Diagnosis: SAME  Surgeon: Merri Ray Hulen Mandler   Assistants: Dr. Excell Seltzer  Anesthesia: General endotracheal anesthesia  ASA Class: 2   Surgeon: Sterling Big , MD FACS  Anesthesia: Gen. with endotracheal tube   Findings: Chronic Diverticulitis Dense adhesions from sigmoid to pelvic wall and from omentum to abdominal wall No evidence of intraoperative leak  Estimated Blood Loss: 50cc         Drains: # 19 FR pelvic         Specimens: Sigmoid colon       Complications: none                Condition:  STABLE  Procedure Details  The patient was seen again in the Holding Room. The benefits, complications, treatment options, and expected outcomes were discussed with the patient. The risks of bleeding, infection, recurrence of symptoms, failure to resolve symptoms,  bowel injury, any of which could require further surgery were reviewed with the patient.   The patient was taken to Operating Room, identified as Maria Mcbride and the procedure verified.  A Time Out was held and the above information confirmed.  Prior to the induction of general anesthesia, antibiotic prophylaxis was administered. VTE prophylaxis was in place. General endotracheal anesthesia was then administered and tolerated well. After the induction, the abdomen was prepped with Chloraprep and draped in the sterile fashion. The patient was positioned in lithotomy position. 7 cm incision was created as a midline mini laparotomy. The abdominal cavity was entered under direct visualization and the GelPort device was placed. A 5 mm port was placed in the suprapubic area under direct visualization and pneumoperitoneum was obtained. There were dense adhesions from the  omentum to the abdominal wall that where lysed in the standard fashion with the Harmonic scalpel. We also were able to place a 12 mm port in the right lower quadrant and a 5 mm port in the left lower quadrant under direct visualization. There was significant adhesive disease in the pelvis from the sigmoid to the pelvic wall and also from the sigmoid to the ovary and the uterus. This adhesions were lysed with a combination of finger fracturing and Harmonic scalpel. The white line of pot was identified and divided and we mobilized the descending colon IN a lateral to medial fashion. We preserved the ureter at all times. We were also able to mobilize the splenic flexure using Harmonic scalpel in the standard fashion. We identified the takeoff of the inferior mesenteric artery dissected the pedicle and divided using a 60 mm vascular echelon stapler in the standard fashion. Using the Harmonic's scalpel were able to divide the mesorectum and and also divided proximal to the mesentery of the descending colon. Once we have an adequate visualization and mobilization we divided the sigmoid colon distally at the junction of the rectosigmoid area with multiple blue loads using the echelon stapler. We'll remove the GelPort and visualized the colon in a direct fashion. An divided at the mid descending colon with standard 60 mm blue load. We opened the stopped and measure the diameter of the bowel. A 29 mm dilator was perfect size. A pursestring was used after in certain the anvil device. The Excell Seltzer and was able to pass a 29 mm standard EEA stapler device through the anus and  we had a little bit of difficulty but were able to finally pass the device through the end of the rectal stump. Under direct visualization we perform an end to end anastomosis with the EEA device. A leak test was performed inflating the colon with a Toomey syringe and a rubber catheter. No evidence of leak was observed. There was also adequate hemostasis.  A 19 Blake drain was placed in the pelvis. We were able to mobilize the omentum and I created an omental flap to attach it to the anastomosis. The drain was sutured in place with a 2-0 silk. All the laparoscopic ports were removed and a second look showed no evidence of any bleeding or any other injuries. We changed gloves and place a new tray to close the   abdomen with a 2-0 PDS suture in a running fashion and the skin was closed with 4-0 Monocryl. Liposomal Marcaine  was injected on all incision sites under direct visualization. Dermabond was used to coat all the skin incisions. Needle and laparotomy count were correct and there were no immediate occasions   Sterling Bigiego Gwenith Tschida, MD, FACS

## 2016-01-05 NOTE — Interval H&P Note (Signed)
History and Physical Interval Note:  01/05/2016 9:51 AM  Maria Mcbride  has presented today for surgery, with the diagnosis of Diverticulitis  The various methods of treatment have been discussed with the patient and family. After consideration of risks, benefits and other options for treatment, the patient has consented to  Procedure(s): HAND ASSISTED LAPAROSCOPIC COLON RESECTION (N/A) as a surgical intervention .  The patient's history has been reviewed, patient examined, no change in status, stable for surgery.  I have reviewed the patient's chart and labs.  Questions were answered to the patient's satisfaction.     Diego F Pabon

## 2016-01-05 NOTE — H&P (View-Only) (Signed)
Maria Mcbride is following up for complicated diverticulitis with abscess. She now has recovered from her diverticulitis and had a recent colonoscopy showing no evidence of malignancy. Currently she is symptom free and is rated to have her colectomy scheduled   PE NAD in good spitrits Abd Soft, NT.  A/P complicated diverticulitis with abscess in need for sigmoid colectomy. Discussed with the patient in detail about the procedure. As his laparoscopic sigmoid colectomy possible open possible ostomy. Discussed with patient in detail about the procedure and the expected postoperative course. Risks benefits and possible complications including but not limited to: Bleeding, infection, anastomotic leak, need for colostomy and possible re-interventions. She understands and wishes to proceed. We will schedule her for an elective laparoscopic sigmoid colectomy. All questions were answered, total time spent is encounter was 25 minutes with the majority of time being allocated to counseling the patient about the surgery 

## 2016-01-06 LAB — CBC
HCT: 33.6 % — ABNORMAL LOW (ref 35.0–47.0)
Hemoglobin: 11.2 g/dL — ABNORMAL LOW (ref 12.0–16.0)
MCH: 28.8 pg (ref 26.0–34.0)
MCHC: 33.4 g/dL (ref 32.0–36.0)
MCV: 86.1 fL (ref 80.0–100.0)
PLATELETS: 210 10*3/uL (ref 150–440)
RBC: 3.9 MIL/uL (ref 3.80–5.20)
RDW: 16.1 % — AB (ref 11.5–14.5)
WBC: 13.1 10*3/uL — ABNORMAL HIGH (ref 3.6–11.0)

## 2016-01-06 LAB — BASIC METABOLIC PANEL
Anion gap: 9 (ref 5–15)
BUN: 10 mg/dL (ref 6–20)
CALCIUM: 8 mg/dL — AB (ref 8.9–10.3)
CO2: 24 mmol/L (ref 22–32)
CREATININE: 0.73 mg/dL (ref 0.44–1.00)
Chloride: 105 mmol/L (ref 101–111)
GFR calc Af Amer: >60 mL/min (ref >60)
GFR calc non Af Amer: >60 mL/min (ref >60)
GLUCOSE: 123 mg/dL — AB (ref 65–99)
Potassium: 3.8 mmol/L (ref 3.5–5.1)
Sodium: 138 mmol/L (ref 135–145)

## 2016-01-06 LAB — GLUCOSE, CAPILLARY
GLUCOSE-CAPILLARY: 101 mg/dL — AB (ref 65–99)
GLUCOSE-CAPILLARY: 106 mg/dL — AB (ref 65–99)
GLUCOSE-CAPILLARY: 105 mg/dL — AB (ref 65–99)
Glucose-Capillary: 83 mg/dL (ref 65–99)

## 2016-01-06 MED ORDER — OXYCODONE HCL 5 MG PO TABS
5.0000 mg | ORAL_TABLET | ORAL | Status: DC | PRN
  Administered 2016-01-06 – 2016-01-07 (×3): 5 mg via ORAL
  Administered 2016-01-07 (×2): 10 mg via ORAL
  Administered 2016-01-08 – 2016-01-09 (×4): 5 mg via ORAL
  Filled 2016-01-06 (×6): qty 1
  Filled 2016-01-06: qty 2
  Filled 2016-01-06 (×3): qty 1

## 2016-01-06 MED ORDER — ALUM & MAG HYDROXIDE-SIMETH 200-200-20 MG/5ML PO SUSP
30.0000 mL | ORAL | Status: DC | PRN
  Administered 2016-01-06 (×2): 30 mL via ORAL
  Filled 2016-01-06 (×2): qty 30

## 2016-01-06 NOTE — Progress Notes (Signed)
POD # 1 lap sigmoid Doing very well No compliants Minimal pain No flatus  AVSS Good U/o Labs ok, sugars well controlled   PE NAD Abd: soft, incisions c/d/i, serosanguineous fluid from JP, no peritonitis Ext: no edema  A/P doing well Keep clears Mobilize DC foley

## 2016-01-07 LAB — GLUCOSE, CAPILLARY
GLUCOSE-CAPILLARY: 89 mg/dL (ref 65–99)
Glucose-Capillary: 106 mg/dL — ABNORMAL HIGH (ref 65–99)
Glucose-Capillary: 84 mg/dL (ref 65–99)
Glucose-Capillary: 75 mg/dL (ref 65–99)

## 2016-01-07 LAB — SURGICAL PATHOLOGY

## 2016-01-07 NOTE — Progress Notes (Signed)
2 Days Post-Op  Subjective: Status post laparoscopic sigmoid colon resection. She has no complaints today but has not passed much gas if any she has no nausea or vomiting and her pain is being well controlled  Objective: Vital signs in last 24 hours: Temp:  [98.1 F (36.7 C)-98.3 F (36.8 C)] 98.3 F (36.8 C) (04/28 0454) Pulse Rate:  [78-85] 82 (04/28 0454) Resp:  [18-20] 20 (04/28 0454) BP: (112-130)/(62-83) 130/83 mmHg (04/28 0454) SpO2:  [97 %-100 %] 98 % (04/28 0454) Last BM Date: 01/05/16  Intake/Output from previous day: 04/27 0701 - 04/28 0700 In: 2218.3 [P.O.:600; I.V.:1618.3] Out: 630 [Urine:520; Drains:110] Intake/Output this shift: Total I/O In: 562.5 [I.V.:562.5] Out: 1010 [Urine:1000; Drains:10]  Physical exam:  Abdomen is obese soft nontender wounds are clean drain is in place without significant drainage.  Lab Results: CBC   Recent Labs  01/05/16 1631 01/06/16 0429  WBC 16.4* 13.1*  HGB 12.2 11.2*  HCT 36.4 33.6*  PLT 217 210   BMET  Recent Labs  01/05/16 1631 01/06/16 0429  NA  --  138  K  --  3.8  CL  --  105  CO2  --  24  GLUCOSE  --  123*  BUN  --  10  CREATININE 0.91 0.73  CALCIUM  --  8.0*   PT/INR No results for input(s): LABPROT, INR in the last 72 hours. ABG No results for input(s): PHART, HCO3 in the last 72 hours.  Invalid input(s): PCO2, PO2  Studies/Results: No results found.  Anti-infectives: Anti-infectives    Start     Dose/Rate Route Frequency Ordered Stop   01/05/16 0328  ertapenem (INVANZ) 1 g in sodium chloride 0.9 % 50 mL IVPB     1 g 100 mL/hr over 30 Minutes Intravenous On call to O.R. 01/05/16 0328 01/05/16 1038      Assessment/Plan: s/p Procedure(s): HAND ASSISTED LAPAROSCOPIC COLON RESECTION   She doing very well following laparoscopic sigmoid colectomy she is tolerating clear liquid diet has not passed much gas yet. Continue current therapy until bowel function returns. No labs were available  this morning will recheck tomorrow morning*  Lattie Hawichard E Rollin Kotowski, MD, FACS  01/07/2016

## 2016-01-07 NOTE — Progress Notes (Signed)
01/07/2016 2:16 PM  Upon returning to the unit after break, NT Posey ProntoKristen Isley informed me that pt passed gas, and noticed a small mount of red blood running out of her rectum.  NT helped pt get cleaned up and so far there has been no repeat incident.  Attempted to call MD.  Will continue until reaching MD to ask further orders or instructions.  Provided emotional support in the mean time.    Bradly Chrisougherty, Trelyn Vanderlinde E, RN

## 2016-01-07 NOTE — Progress Notes (Signed)
POD # 2 Doing great Ambulating in halls AVSS Passed flatus Minimal pain  PE NAD abd soft. No peritonitis. Drain serous.  A/P Doing well Advance diet for tomorrow DC 24-48 hrs

## 2016-01-07 NOTE — Progress Notes (Signed)
01/07/2016 2:26 PM  Spoke with Dr Excell Seltzerooper regarding bleeding from rectum.  No new orders at this time as such bleeding is not uncommon after colon resection.  Will continue to monitor and assess.  Bradly Chrisougherty, Naszir Cott E, RN

## 2016-01-08 ENCOUNTER — Inpatient Hospital Stay: Payer: BLUE CROSS/BLUE SHIELD

## 2016-01-08 LAB — CBC WITH DIFFERENTIAL/PLATELET
Basophils Absolute: 0 10*3/uL (ref 0–0.1)
Basophils Relative: 1 %
Eosinophils Absolute: 0.2 10*3/uL (ref 0–0.7)
Eosinophils Relative: 3 %
HCT: 30.8 % — ABNORMAL LOW (ref 35.0–47.0)
HEMOGLOBIN: 10.4 g/dL — AB (ref 12.0–16.0)
LYMPHS ABS: 1.9 10*3/uL (ref 1.0–3.6)
LYMPHS PCT: 27 %
MCH: 28.9 pg (ref 26.0–34.0)
MCHC: 33.8 g/dL (ref 32.0–36.0)
MCV: 85.5 fL (ref 80.0–100.0)
Monocytes Absolute: 0.5 10*3/uL (ref 0.2–0.9)
Monocytes Relative: 7 %
NEUTROS ABS: 4.4 10*3/uL (ref 1.4–6.5)
NEUTROS PCT: 62 %
Platelets: 190 10*3/uL (ref 150–440)
RBC: 3.61 MIL/uL — AB (ref 3.80–5.20)
RDW: 16.5 % — ABNORMAL HIGH (ref 11.5–14.5)
WBC: 7 10*3/uL (ref 3.6–11.0)

## 2016-01-08 LAB — BASIC METABOLIC PANEL
ANION GAP: 9 (ref 5–15)
BUN: 7 mg/dL (ref 6–20)
CHLORIDE: 105 mmol/L (ref 101–111)
CO2: 26 mmol/L (ref 22–32)
Calcium: 8.1 mg/dL — ABNORMAL LOW (ref 8.9–10.3)
Creatinine, Ser: 0.64 mg/dL (ref 0.44–1.00)
GFR calc Af Amer: >60 mL/min (ref >60)
GFR calc non Af Amer: >60 mL/min (ref >60)
GLUCOSE: 95 mg/dL (ref 65–99)
POTASSIUM: 3 mmol/L — AB (ref 3.5–5.1)
SODIUM: 140 mmol/L (ref 135–145)

## 2016-01-08 LAB — GLUCOSE, CAPILLARY
GLUCOSE-CAPILLARY: 85 mg/dL (ref 65–99)
GLUCOSE-CAPILLARY: 79 mg/dL (ref 65–99)
Glucose-Capillary: 95 mg/dL (ref 65–99)
Glucose-Capillary: 118 mg/dL — ABNORMAL HIGH (ref 65–99)

## 2016-01-08 MED ORDER — ACETAMINOPHEN 325 MG PO TABS
650.0000 mg | ORAL_TABLET | ORAL | Status: DC
  Administered 2016-01-08 – 2016-01-09 (×3): 650 mg via ORAL
  Filled 2016-01-08 (×4): qty 2

## 2016-01-08 MED ORDER — POTASSIUM CHLORIDE CRYS ER 20 MEQ PO TBCR
40.0000 meq | EXTENDED_RELEASE_TABLET | Freq: Two times a day (BID) | ORAL | Status: DC
  Administered 2016-01-08 (×2): 40 meq via ORAL
  Filled 2016-01-08 (×2): qty 2

## 2016-01-08 NOTE — Progress Notes (Signed)
POD # 3 Doing well some SOB last night, similar episode to the one she had 3 months ago when she was hospitalized K low will replace AVSS Passing gas and hungry Some bleeding from abdominal wall where she was given the lovenox shots  PE NAD Abd: soft, incisions c/d/i, no peritonitis, some serous drainage around JP. No infection  A/P doing well CXR Mobilize pulm toilet Replace k Keep lovenox for now

## 2016-01-09 LAB — GLUCOSE, CAPILLARY: Glucose-Capillary: 99 mg/dL (ref 65–99)

## 2016-01-09 MED ORDER — OXYCODONE-ACETAMINOPHEN 5-325 MG PO TABS: 1.0000 | ORAL_TABLET | ORAL | Status: DC | PRN

## 2016-01-09 MED ORDER — CYCLOBENZAPRINE HCL 10 MG PO TABS: 10.0000 mg | ORAL_TABLET | Freq: Three times a day (TID) | ORAL | Status: DC | PRN

## 2016-01-09 NOTE — Progress Notes (Signed)
01/09/2016 10:31 AM  Sofie HartiganKristie S Langworthy to be D/C'd Home per MD order.  Discussed prescriptions and follow up appointments with the patient. Prescriptions given to patient, medication list explained in detail. Pt verbalized understanding.    Medication List    STOP taking these medications        polyethylene glycol powder powder  Commonly known as:  GLYCOLAX/MIRALAX      TAKE these medications        bisacodyl 5 MG EC tablet  Generic drug:  bisacodyl  Take 4 tablets at 8AM the day before your surgery.     cyclobenzaprine 10 MG tablet  Commonly known as:  FLEXERIL  Take 1 tablet (10 mg total) by mouth 3 (three) times daily as needed for muscle spasms.     erythromycin base 500 MG tablet  Commonly known as:  E-MYCIN  Take 2 tablets (1,000 mg total) by mouth 3 (three) times daily.     metFORMIN 500 MG tablet  Commonly known as:  GLUCOPHAGE  Take 500 mg by mouth 2 (two) times daily with a meal.     metroNIDAZOLE 500 MG tablet  Commonly known as:  FLAGYL  Take 1 tablet (500 mg total) by mouth 3 (three) times daily.     neomycin 500 MG tablet  Commonly known as:  MYCIFRADIN  Take 2 tablets (1,000 mg total) by mouth 3 (three) times daily.     olmesartan-hydrochlorothiazide 20-12.5 MG tablet  Commonly known as:  BENICAR HCT  Take 1 tablet by mouth daily.     olopatadine 0.1 % ophthalmic solution  Commonly known as:  PATANOL  Place 1 drop into both eyes 2 (two) times daily.     oxyCODONE-acetaminophen 5-325 MG tablet  Commonly known as:  ROXICET  Take 1-2 tablets by mouth every 4 (four) hours as needed for moderate pain.        Filed Vitals:   01/08/16 2109 01/09/16 0600  BP: 118/78 137/84  Pulse: 83 69  Temp: 98.1 F (36.7 C) 97.6 F (36.4 C)  Resp: 16 18    Skin clean, dry and intact without evidence of skin break down, no evidence of skin tears noted. IV catheter discontinued intact. Site without signs and symptoms of complications. Dressing and pressure  applied. Pt denies pain at this time. No complaints noted.  An After Visit Summary was printed and given to the patient. Patient escorted via WC, and D/C home via private auto.  Bradly Chrisougherty, Devante Capano E

## 2016-01-09 NOTE — Discharge Summary (Signed)
Patient ID: Maria Mcbride MRN: [id number] DOB/AGE: [birth year] 39 y.o.  Admit date: 01/05/2016 Discharge date: 01/09/2016   Discharge Diagnoses:  Active Problems:   Diverticulitis of colon   Diverticulitis large intestine   Procedures: Laparoscopic sigmoid colectomy  Hospital Course: Maria Mcbride is a very nice 40 year old female with a history of diabetes and complicated diverticulitis requiring drain placement. Admitted for elective sigmoid colectomy. He underwent a laparoscopic sigmoid colectomy and did very well. She had adequate pain control and return of bowel function within 3 days. Her diet was advanced slowly from clear liquid diet to a regular diet. I did drain was always series. At the time of discharge she was ambulating, her vital signs were stable, she was afebrile, her abdomen was soft. Incisions were healing well JP with serous output was removed. There was no evidence of infection or peritonitis. She was having bowel movements and passing gas. Condition of the patient at time of discharge is stable   Disposition: 01-Home or Self Care  Discharge Instructions    Call MD for:  difficulty breathing, headache or visual disturbances    Complete by:  As directed      Call MD for:  extreme fatigue    Complete by:  As directed      Call MD for:  hives    Complete by:  As directed      Call MD for:  persistant dizziness or light-headedness    Complete by:  As directed      Call MD for:  persistant nausea and vomiting    Complete by:  As directed      Call MD for:  redness, tenderness, or signs of infection (pain, swelling, redness, odor or green/yellow discharge around incision site)    Complete by:  As directed      Call MD for:  severe uncontrolled pain    Complete by:  As directed      Call MD for:  temperature >100.4    Complete by:  As directed      Change dressing (specify)    Complete by:  As directed   Dressing change: Daily dry dressing to the drain site     Diet  - low sodium heart healthy    Complete by:  As directed      Discharge instructions    Complete by:  As directed   No heavy lifting no more than 20 pounds. Shower daily. May place a dry gauze on drain site     Increase activity slowly    Complete by:  As directed      Lifting restrictions    Complete by:  As directed   20 lbs            Medication List    STOP taking these medications        polyethylene glycol powder powder  Commonly known as:  GLYCOLAX/MIRALAX      TAKE these medications        bisacodyl 5 MG EC tablet  Generic drug:  bisacodyl  Take 4 tablets at 8AM the day before your surgery.     cyclobenzaprine 10 MG tablet  Commonly known as:  FLEXERIL  Take 1 tablet (10 mg total) by mouth 3 (three) times daily as needed for muscle spasms.     erythromycin base 500 MG tablet  Commonly known as:  E-MYCIN  Take 2 tablets (1,000 mg total) by mouth 3 (three) times daily.  metFORMIN 500 MG tablet  Commonly known as:  GLUCOPHAGE  Take 500 mg by mouth 2 (two) times daily with a meal.     metroNIDAZOLE 500 MG tablet  Commonly known as:  FLAGYL  Take 1 tablet (500 mg total) by mouth 3 (three) times daily.     neomycin 500 MG tablet  Commonly known as:  MYCIFRADIN  Take 2 tablets (1,000 mg total) by mouth 3 (three) times daily.     olmesartan-hydrochlorothiazide 20-12.5 MG tablet  Commonly known as:  BENICAR HCT  Take 1 tablet by mouth daily.     olopatadine 0.1 % ophthalmic solution  Commonly known as:  PATANOL  Place 1 drop into both eyes 2 (two) times daily.     oxyCODONE-acetaminophen 5-325 MG tablet  Commonly known as:  ROXICET  Take 1-2 tablets by mouth every 4 (four) hours as needed for moderate pain.           Follow-up Information    Follow up with Maria Roiego F Berlinda Farve, MD In 2 weeks.   Specialty:  General Surgery   Why:  For wound re-check   Contact information:   [address] [place] [address] [phone]        Maria Mcbride  Maria Lecy, MD FACS

## 2016-01-12 ENCOUNTER — Telehealth: Payer: Self-pay

## 2016-01-12 NOTE — Telephone Encounter (Signed)
[employer] disability form was filled out and faxed.

## 2016-01-14 ENCOUNTER — Institutional Professional Consult (permissible substitution): Payer: BLUE CROSS/BLUE SHIELD | Admitting: Internal Medicine

## 2016-01-14 ENCOUNTER — Encounter: Payer: Self-pay | Admitting: Internal Medicine

## 2016-01-14 ENCOUNTER — Ambulatory Visit (INDEPENDENT_AMBULATORY_CARE_PROVIDER_SITE_OTHER): Payer: BLUE CROSS/BLUE SHIELD | Admitting: Internal Medicine

## 2016-01-14 VITALS — BP 124/70 | HR 98 | Ht 62.0 in | Wt 186.0 lb

## 2016-01-14 DIAGNOSIS — G4719 Other hypersomnia: Secondary | ICD-10-CM

## 2016-01-14 NOTE — Patient Instructions (Signed)
Will send for sleep study.    Sleep Apnea Sleep apnea is disorder that affects a person's sleep. A person with sleep apnea has abnormal pauses in their breathing when they sleep. It is hard for them to get a good sleep. This makes a person tired during the day. It also can lead to other physical problems. There are three types of sleep apnea. One type is when breathing stops for a short time because your airway is blocked (obstructive sleep apnea). Another type is when the brain sometimes fails to give the normal signal to breathe to the muscles that control your breathing (central sleep apnea). The third type is a combination of the other two types. HOME CARE   Take all medicine as told by your doctor.  Avoid alcohol, calming medicines (sedatives), and depressant drugs.  Try to lose weight if you are overweight. Talk to your doctor about a healthy weight goal.  Your doctor may have you use a device that helps to open your airway. It can help you get the air that you need. It is called a positive airway pressure (PAP) device.   MAKE SURE YOU:   Understand these instructions.  Will watch your condition.  Will get help right away if you are not doing well or get worse.  It may take approximately 1 month for you to get used to wearing her CPAP every night.   

## 2016-01-14 NOTE — Addendum Note (Signed)
Addended by: Maxwell MarionBLANKENSHIP, MARGIE A on: 01/14/2016 11:52 AM   Modules accepted: Orders

## 2016-01-14 NOTE — H&P (Signed)
  Pre-Procedure History & Physical: HPI: Maria Mcbride is a 40 y.o. female is here for sigmoid colectomy for perforated diverticulitis.  Past Medical History  Diagnosis Date  . GERD (gastroesophageal reflux disease)   . Diverticulitis   . Hypertension     CONTROLLED ON MEDS  . Pneumonia FEB 26,2017    DR SAYS CLEAR  . Diabetes mellitus without complication (HCC)     BORDERLINE  . Sleep apnea     PATIENT THINKS MAY HAVE/ HAS APPT WITH PULMONARY DR AT [facility]    Past Surgical History  Procedure Laterality Date  . Cesarean section  2007 and 2011  . Cholecystectomy  2001    Dr. Pat Patrick  . Trans gluteal drain placement      SEDATION    Prior to Admission medications   Medication Sig Start Date End Date Taking? Authorizing Provider  Multiple Vitamin (MULTIVITAMIN) tablet Take 1 tablet by mouth daily.   Yes Historical Provider, MD  vitamin C (ASCORBIC ACID) 500 MG tablet Take 500 mg by mouth daily.   Yes Historical Provider, MD  metFORMIN (GLUCOPHAGE-XR) 500 MG 24 hr tablet Take 1 tablet (500 mg total) by mouth 2 (two) times daily. 11/24/15   Pleas Koch, NP  Na Sulfate-K Sulfate-Mg Sulf (SUPREP BOWEL PREP) SOLN Take 1 kit by mouth as directed. 12/02/15   Lucilla Lame, MD  olmesartan-hydrochlorothiazide (BENICAR HCT) 20-12.5 MG tablet Take 1 tablet by mouth daily. Patient taking differently: Take 1 tablet by mouth daily. AM 11/24/15   Pleas Koch, NP  Probiotic Product (PROBIOTIC ACIDOPHILUS BEADS PO) Take 1 tablet by mouth daily. Reported on 12/14/2015    Historical Provider, MD    Allergies as of 12/02/2015  . (No Known Allergies)    Family History  Problem Relation Age of Onset  . Hyperlipidemia Mother   . Hypothyroidism Sister   . Sleep apnea Mother   . Diabetes Maternal Grandmother     Social History   Social History  . Marital  Status: Married    Spouse Name: N/A  . Number of Children: N/A  . Years of Education: N/A   Occupational History  . Not on file.   Social History Main Topics  . Smoking status: Never Smoker   . Smokeless tobacco: Never Used  . Alcohol Use: No  . Drug Use: No  . Sexual Activity: Not on file   Other Topics Concern  . Not on file   Social History Narrative   Married.   2 children.    Works as a Software engineer.   Enjoys camping, being out doors, Print production planner.     Review of Systems: See HPI, otherwise negative ROS  Physical Exam: Ht '5\' 2"'$  (1.575 m)  Wt 185 lb (83.915 kg)  BMI 33.83 kg/m2 General: Alert, pleasant and cooperative in NAD Head: Normocephalic and atraumatic. Neck: Supple; no masses or thyromegaly. Lungs: Clear throughout to auscultation.  Heart: Regular rate and rhythm. Abdomen: Soft, nontender and nondistended. Normal bowel sounds, without guarding, and without rebound.  Neurologic: Alert and oriented x4; grossly normal neurologically.  Impression/Plan: For elective sigmoid colectomy. All questions answered

## 2016-01-14 NOTE — Progress Notes (Signed)
[facility] Pulmonary Medicine Consultation      Assessment and Plan:  Excessive daytime sleepiness. -The patient has symptoms and signs consistent with obstructive sleep apnea. She is extremely sleepy during the day with an Epworth score of 23 out of 27. - we'll send for sleep study.  Diabetes mellitus. -Diabetes mellitus, which appears to be adequately controlled at this time, we discussed importance of treating sleep apnea as it can contribute to hyperglycemia.  Essential hypertension. -May be related to obstructive sleep apnea, as hypertension is better controlled when she sleeps well.  Date: 01/14/2016  MRN# [id number] Maria Mcbride Sep 05, 1976  Referring Physician: Dr. Chestine Spore.   Maria Mcbride is a 40 y.o. old female seen in consultation for chief complaint of:    Chief Complaint  Patient presents with  . sleep consult    pt ref by dr. Chestine Spore. c/o loud snoring, restless sleep, daytime sleepiness, choking & stops breathing during sleep. EPWORTH: 23     HPI:   The patient is a 40 year old female, status post laparoscopic sigmoid colectomy and lysis of adhesions due to diverticulitis, which took place approximately 2 weeks ago. She is referred today due to symptoms of excessive daytime sleepiness, she is also noted to have loud snoring has been told that she chokes and stops breathing in her sleep. Her Epworth score was very elevated at 23 (max 27).  Patient notes that she typically goes to bed between 11 PM and midnight reviewed it takes her 5-10 minutes to fall asleep, she wakes numerous times per night and stops breathing overnight. She typically gets out of bed at around 6:30 AM.  Her husband is present and gives history of witnessed apneas going on for a few years. It gets worse if her weight goes up. She has trouble staying awake when she sits in a car. 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 She does not take naps.  No sleep attacks, she drinks about 2  cups per day.    PMHX:   Past Medical History  Diagnosis Date  . GERD (gastroesophageal reflux disease)   . Diverticulitis   . Hypertension     CONTROLLED ON MEDS  . Pneumonia FEB 26,2017    DR SAYS CLEAR  . Diabetes mellitus without complication (HCC)     BORDERLINE  . Sleep apnea     PATIENT THINKS MAY HAVE/ HAS APPT WITH PULMONARY DR AT [facility]   Surgical Hx:  Past Surgical History  Procedure Laterality Date  . Cesarean section  2007 and 2011  . Cholecystectomy  2001    Dr. Michela Pitcher  . Trans gluteal drain placement      SEDATION  . Colonoscopy with propofol N/A 12/16/2015    Procedure: COLONOSCOPY WITH PROPOFOL;  Surgeon: Midge Minium, MD;  Location: [facility];  Service: Endoscopy;  Laterality: N/A;  DIABETIC/ ORAL MED/ IUD  . Colon resection N/A 01/05/2016    Procedure: HAND ASSISTED LAPAROSCOPIC COLON RESECTION;  Surgeon: Leafy Ro, MD;  Location: [facility] OR[facility];  Service: General;  Laterality: N/A;   Family Hx:  Family History  Problem Relation Age of Onset  . Hyperlipidemia Mother   . Hypothyroidism Sister   . Sleep apnea Mother   . Diabetes Maternal Grandmother    Social Hx:   Social History  Substance Use Topics  . Smoking status: Never Smoker   . Smokeless tobacco: Never Used  . Alcohol Use: No   Medication:  Current Outpatient Rx  Name  Route  Sig  Dispense  Refill  . BISACODYL 5 MG EC tablet      Take 4 tablets at 8AM the day before your surgery.   4 tablet   0     Dispense as written.   . cyclobenzaprine (FLEXERIL) 10 MG tablet   Oral   Take 1 tablet (10 mg total) by mouth 3 (three) times daily as needed for muscle spasms.   30 tablet   0   . erythromycin base (E-MYCIN) 500 MG tablet   Oral   Take 2 tablets (1,000 mg total) by mouth 3 (three) times daily.   6 tablet   0   . metFORMIN (GLUCOPHAGE) 500 MG tablet   Oral   Take 500 mg by mouth 2 (two) times daily with a meal.         . metroNIDAZOLE (FLAGYL) 500 MG tablet   Oral   Take 1  tablet (500 mg total) by mouth 3 (three) times daily. Patient not taking: Reported on 01/05/2016   3 tablet   0   . neomycin (MYCIFRADIN) 500 MG tablet   Oral   Take 2 tablets (1,000 mg total) by mouth 3 (three) times daily.   6 tablet   0   . olmesartan-hydrochlorothiazide (BENICAR HCT) 20-12.5 MG tablet   Oral   Take 1 tablet by mouth daily. Patient taking differently: Take 1 tablet by mouth daily. AM   90 tablet   2   . olopatadine (PATANOL) 0.1 % ophthalmic solution   Both Eyes   Place 1 drop into both eyes 2 (two) times daily. Patient taking differently: Place 1 drop into both eyes 2 (two) times daily as needed.    5 mL   0   . oxyCODONE-acetaminophen (ROXICET) 5-325 MG tablet   Oral   Take 1-2 tablets by mouth every 4 (four) hours as needed for moderate pain.   30 tablet   0       Allergies:  Review of patient's allergies indicates no known allergies.  Review of Systems: Gen:  Denies  fever, sweats, chills HEENT: Denies blurred vision, double vision. bleeds, sore throat Cvc:  No dizziness, chest pain. Resp:   Denies cough or sputum production, shortness of breath Gi: Denies swallowing difficulty, stomach pain. Gu:  Denies bladder incontinence, burning urine Ext:   No Joint pain, stiffness. Skin: No skin rash,  hives  Endoc:  No polyuria, polydipsia. Psych: No depression, insomnia. Other:  All other systems were reviewed with the patient and were negative other that what is mentioned in the HPI.   Physical Examination:   VS: BP 124/70 mmHg  Pulse 98  Ht  (1.575 m)  Wt 186 lb (84.369 kg)  BMI 34.01 kg/m2  SpO2 97%  General Appearance: No distress  Neuro:without focal findings,  speech normal,  HEENT: PERRLA, EOM intact.  [place] 4.  Pulmonary: normal breath sounds, No wheezing.  CardiovascularNormal S1,S2.  No m/r/g.   Abdomen: Benign, Soft, non-tender. Renal:  No costovertebral tenderness  GU:  No performed at this time. Endoc: No evident  thyromegaly, no signs of acromegaly. Skin:   warm, no rashes, no ecchymosis  Extremities: normal, no cyanosis, clubbing.  Other findings:    LABORATORY PANEL:   CBC  Recent Labs Lab 01/08/16 0513  WBC 7.0  HGB 10.4*  HCT 30.8*  PLT 190   ------------------------------------------------------------------------------------------------------------------  Chemistries   Recent Labs Lab 01/08/16 0513  NA  140  K 3.0*  CL 105  CO2 26  GLUCOSE 95  BUN 7  CREATININE 0.64  CALCIUM 8.1*   ------------------------------------------------------------------------------------------------------------------  Cardiac Enzymes No results for input(s): TROPONINI in the last 168 hours. ------------------------------------------------------------  RADIOLOGY:  No results found.     Thank  you for the consultation and for allowing [facility] to assist in the care of your patient. Our recommendations are noted above.  Please contact us if we can be of further service.   Wells Guileseep Zakyra Kukuk, MD.  Board Certified in Internal Medicine, Pulmonary Medicine, Critical Care Medicine, and Sleep Medicine.  [facility] Office Number: [phone]  Santiago Gladavid Kasa, M.D.  Stephanie AcreVishal Mungal, M.D.  Billy Fischeravid Simonds, M.D  01/14/2016

## 2016-01-17 ENCOUNTER — Ambulatory Visit (INDEPENDENT_AMBULATORY_CARE_PROVIDER_SITE_OTHER): Payer: BLUE CROSS/BLUE SHIELD | Admitting: General Surgery

## 2016-01-17 ENCOUNTER — Encounter: Payer: Self-pay | Admitting: General Surgery

## 2016-01-17 VITALS — BP 117/69 | HR 90 | Temp 98.2°F | Ht 62.0 in | Wt 187.0 lb

## 2016-01-17 DIAGNOSIS — Z4889 Encounter for other specified surgical aftercare: Secondary | ICD-10-CM

## 2016-01-17 MED ORDER — HYDROCORTISONE 1 % EX CREA: TOPICAL_CREAM | CUTANEOUS | Status: DC

## 2016-01-17 NOTE — Progress Notes (Signed)
Outpatient Surgical Follow Up  01/17/2016  Maria Mcbride is an 40 y.o. female.   Chief Complaint  Patient presents with  . Routine Post Op    Laparoscopic Sigmoid Colectomy (4/26)- Dr. Everlene Farrier    HPI: 40 year old female returns to clinic 2 weeks status post laparoscopic sigmoid colectomy. Patient reports doing well. Having some abdominal cramping after eating but states this is improving. States thus far her pain has been controlled during the day with Aleve she only takes a Percocet on occasion at night. His sleep. Her only real complaint today is of a rash to her back, upper thighs, upper extremities. She states this feels like a poison oak exposure she's had before. She denies any fevers, chills, nausea, vomiting, diarrhea, constipation, chest pain, shortness of breath. She's been very happy with her surgical drainage.  Past Medical History  Diagnosis Date  . GERD (gastroesophageal reflux disease)   . Diverticulitis   . Hypertension     CONTROLLED ON MEDS  . Pneumonia FEB 26,2017    DR SAYS CLEAR  . Diabetes mellitus without complication (HCC)     BORDERLINE  . Sleep apnea     PATIENT THINKS MAY HAVE/ HAS APPT WITH PULMONARY DR AT [facility]    Past Surgical History  Procedure Laterality Date  . Cesarean section  2007 and 2011  . Cholecystectomy  2001    Dr. Michela Pitcher  . Trans gluteal drain placement      SEDATION  . Colonoscopy with propofol N/A 12/16/2015    Procedure: COLONOSCOPY WITH PROPOFOL;  Surgeon: Midge Minium, MD;  Location: [facility];  Service: Endoscopy;  Laterality: N/A;  DIABETIC/ ORAL MED/ IUD  . Colon resection N/A 01/05/2016    Procedure: HAND ASSISTED LAPAROSCOPIC COLON RESECTION;  Surgeon: Leafy Ro, MD;  Location: [facility];  Service: General;  Laterality: N/A;    Family History  Problem Relation Age of Onset  . Hyperlipidemia Mother   . Hypothyroidism Sister   . Sleep apnea Mother   . Diabetes Maternal Grandmother     Social History:  reports that she  has never smoked. She has never used smokeless tobacco. She reports that she does not drink alcohol or use illicit drugs.  Allergies: No Known Allergies  Medications reviewed.    ROS A multipoint review of systems was completed. All pertinent positives and negatives are documented in the history of present illness and remainder are negative.   BP 117/69 mmHg  Pulse 90  Temp(Src) 98.2 F (36.8 C) (Oral)  Ht  (1.575 m)  Wt 84.823 kg (187 lb)  BMI 34.19 kg/m2  Physical Exam Gen.: No acute distress Chest: Clear to auscultation Heart: Regular rate and rhythm Abdomen: Soft, nontender, nondistended. Well approximated laparoscopic sigmoid colectomy incision sites without evidence of erythema or drainage. Skin: Skin 2 the back, upper thighs, posterior upper extremities with a maculopapular rash. The dressing is completely away from the surgical sites. It does appear to resemble a contact dermatitis.    No results found for this or any previous visit (from the past 48 hour(s)). No results found.  Assessment/Plan:  1. Aftercare following surgery 40 year old female status post lap scopic sigmoid colectomy. Doing well. Discussed appropriate timeline for returning to normal activities as well as wound care instructions. Given her macular Rash we will provide her with a prescription today for hydrocortisone cream. If this is not resolved problem discuss she should follow up with her primary care provider. We will also have patient  follow up with her operative surgeon 1 week for additional wound checks. Doing well.     Ricarda Frameharles Leeum Sankey, MD FACS General Surgeon  01/17/2016,11:18 AM

## 2016-01-17 NOTE — Patient Instructions (Addendum)
Please see appointment listed below. Apply hydrocortisone cream to affected area twice daily.

## 2016-01-27 ENCOUNTER — Ambulatory Visit (INDEPENDENT_AMBULATORY_CARE_PROVIDER_SITE_OTHER): Payer: BLUE CROSS/BLUE SHIELD | Admitting: Surgery

## 2016-01-27 DIAGNOSIS — Z09 Encounter for follow-up examination after completed treatment for conditions other than malignant neoplasm: Secondary | ICD-10-CM

## 2016-01-27 NOTE — Progress Notes (Signed)
S/ p lap sigmoid colectomy Doing great, taking PO, minimal abd discomfort, + BM Has some groin and thigh papules that are getting better Unsure about the etiology of her skin condition contact dermatitis vs H. Zoster is in the differential  PE NAD Abd: soft, NT, incision c/d/i, no infection Small papules on thigh, scant amount, no erythema, no tenderness  A/P doing well Instructed to see PCP regarding new skin lesion May RTW RTC prn

## 2016-01-27 NOTE — Patient Instructions (Signed)
Please call with any questions or concerns.  Please call your PCP to be seen regarding this rash.

## 2016-02-16 ENCOUNTER — Ambulatory Visit: Payer: BLUE CROSS/BLUE SHIELD | Attending: Pulmonary Disease

## 2016-02-16 DIAGNOSIS — G4733 Obstructive sleep apnea (adult) (pediatric): Secondary | ICD-10-CM | POA: Insufficient documentation

## 2016-02-16 DIAGNOSIS — G4719 Other hypersomnia: Secondary | ICD-10-CM | POA: Diagnosis present

## 2016-02-18 ENCOUNTER — Other Ambulatory Visit: Payer: Self-pay | Admitting: Primary Care

## 2016-02-18 DIAGNOSIS — I10 Essential (primary) hypertension: Secondary | ICD-10-CM

## 2016-02-18 DIAGNOSIS — Z13220 Encounter for screening for lipoid disorders: Secondary | ICD-10-CM

## 2016-02-18 DIAGNOSIS — R7303 Prediabetes: Secondary | ICD-10-CM

## 2016-02-28 ENCOUNTER — Other Ambulatory Visit (INDEPENDENT_AMBULATORY_CARE_PROVIDER_SITE_OTHER): Payer: BLUE CROSS/BLUE SHIELD

## 2016-02-28 DIAGNOSIS — Z13220 Encounter for screening for lipoid disorders: Secondary | ICD-10-CM | POA: Diagnosis not present

## 2016-02-28 DIAGNOSIS — R7303 Prediabetes: Secondary | ICD-10-CM

## 2016-02-28 DIAGNOSIS — G4730 Sleep apnea, unspecified: Secondary | ICD-10-CM | POA: Diagnosis not present

## 2016-02-28 DIAGNOSIS — I10 Essential (primary) hypertension: Secondary | ICD-10-CM | POA: Diagnosis not present

## 2016-02-28 LAB — LIPID PANEL
CHOLESTEROL: 151 mg/dL (ref 0–200)
HDL: 46 mg/dL (ref >39.00)
LDL CALC: 82 mg/dL (ref 0–99)
NonHDL: 104.89
TRIGLYCERIDES: 115 mg/dL (ref 0.0–149.0)
Total CHOL/HDL Ratio: 3
VLDL: 23 mg/dL (ref 0.0–40.0)

## 2016-02-28 LAB — HEMOGLOBIN A1C: HEMOGLOBIN A1C: 6.2 % (ref 4.6–6.5)

## 2016-02-28 LAB — COMPREHENSIVE METABOLIC PANEL
ALT: 26 U/L (ref 0–35)
AST: 18 U/L (ref 0–37)
Albumin: 4.1 g/dL (ref 3.5–5.2)
Alkaline Phosphatase: 57 U/L (ref 39–117)
BILIRUBIN TOTAL: 0.5 mg/dL (ref 0.2–1.2)
BUN: 14 mg/dL (ref 6–23)
CALCIUM: 9.8 mg/dL (ref 8.4–10.5)
CHLORIDE: 101 meq/L (ref 96–112)
CO2: 28 meq/L (ref 19–32)
Creatinine, Ser: 0.8 mg/dL (ref 0.40–1.20)
GFR: 84.02 mL/min (ref >60.00)
Glucose, Bld: 130 mg/dL — ABNORMAL HIGH (ref 70–99)
Potassium: 3.9 mEq/L (ref 3.5–5.1)
Sodium: 138 mEq/L (ref 135–145)
Total Protein: 7.1 g/dL (ref 6.0–8.3)

## 2016-03-03 ENCOUNTER — Encounter: Payer: BLUE CROSS/BLUE SHIELD | Admitting: Primary Care

## 2016-03-03 ENCOUNTER — Telehealth: Payer: Self-pay | Admitting: *Deleted

## 2016-03-03 DIAGNOSIS — G4733 Obstructive sleep apnea (adult) (pediatric): Secondary | ICD-10-CM

## 2016-03-03 NOTE — Telephone Encounter (Signed)
LMOM for pt stating we are placing the order for her CPAP machine and to call back with any questions.

## 2016-03-20 ENCOUNTER — Encounter: Payer: Self-pay | Admitting: Primary Care

## 2016-03-20 ENCOUNTER — Ambulatory Visit (INDEPENDENT_AMBULATORY_CARE_PROVIDER_SITE_OTHER): Payer: BLUE CROSS/BLUE SHIELD | Admitting: Primary Care

## 2016-03-20 VITALS — BP 116/72 | HR 91 | Temp 98.3°F | Ht 62.0 in | Wt 192.8 lb

## 2016-03-20 DIAGNOSIS — Z0000 Encounter for general adult medical examination without abnormal findings: Secondary | ICD-10-CM | POA: Insufficient documentation

## 2016-03-20 DIAGNOSIS — Z1239 Encounter for other screening for malignant neoplasm of breast: Secondary | ICD-10-CM | POA: Diagnosis not present

## 2016-03-20 DIAGNOSIS — I10 Essential (primary) hypertension: Secondary | ICD-10-CM

## 2016-03-20 DIAGNOSIS — R7303 Prediabetes: Secondary | ICD-10-CM | POA: Diagnosis not present

## 2016-03-20 DIAGNOSIS — R0683 Snoring: Secondary | ICD-10-CM

## 2016-03-20 DIAGNOSIS — Z0001 Encounter for general adult medical examination with abnormal findings: Secondary | ICD-10-CM | POA: Insufficient documentation

## 2016-03-20 NOTE — Assessment & Plan Note (Signed)
Immunizations up-to-date. Pap up-to-date. Colonoscopy completed in April 2017. Mammogram due and ordered today. Labs mostly unremarkable with the exception of hyperglycemia. Exam unremarkable. Long discussion today in regards to weight and weight loss. Recommendations provided in regards to diet and exercise. Repeat A1c in 6 months. Follow-up in one year for repeat physical.

## 2016-03-20 NOTE — Assessment & Plan Note (Signed)
Completed sleep study and qualifies for CPAP machine for which she will be set up with later this week.

## 2016-03-20 NOTE — Progress Notes (Signed)
Subjective:    Patient ID: Maria Mcbride, female    DOB: 04/20/1976, 40 y.o.   MRN: [id number]  HPI  Maria Mcbride is a 40 year old female who presents today for complete physical.  Immunizations: -Tetanus: Completed in June 2017. -Influenza: Completed Fall of 2016.   Diet: Completed a fair diet. Breakfast: Biscuit Lunch: Sandwich, chips Dinner: Pasta, meat, eats out at restaurants frequently Snacks: None Desserts: Occasionally  Beverages: Diet soda, water (16 ounces)  Exercise: She does not currently exercise. Eye exam: Completed in 2017. Dental exam: Completed 1 year ago. Due in August. Colonoscopy: Completed in April 2017 Pap Smear: Completed in April 2016. Mammogram: Never completed.    Review of Systems  Constitutional: Negative for unexpected weight change.  HENT: Negative for rhinorrhea.   Respiratory: Negative for cough and shortness of breath.   Cardiovascular: Negative for chest pain.  Gastrointestinal: Negative for diarrhea and constipation.  Genitourinary: Negative for difficulty urinating and menstrual problem.  Musculoskeletal: Negative for myalgias and arthralgias.  Skin: Negative for rash.  Allergic/Immunologic: Negative for environmental allergies.  Neurological: Negative for dizziness, numbness and headaches.  Psychiatric/Behavioral:       Denies concerns for anxiety or depression       Past Medical History  Diagnosis Date  . GERD (gastroesophageal reflux disease)   . Diverticulitis   . Hypertension     CONTROLLED ON MEDS  . Pneumonia FEB 26,2017    DR SAYS CLEAR  . Diabetes mellitus without complication (HCC)     BORDERLINE  . Sleep apnea     PATIENT THINKS MAY HAVE/ HAS APPT WITH PULMONARY DR AT [facility]     Social History   Social History  . Marital Status: Married    Spouse Name: N/A  . Number of Children: N/A  . Years of Education: N/A   Occupational History  . Not on file.   Social History Main Topics  . Smoking status: Never  Smoker   . Smokeless tobacco: Never Used  . Alcohol Use: No  . Drug Use: No  . Sexual Activity: Not on file   Other Topics Concern  . Not on file   Social History Narrative   Married.   2 children.    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     Past Surgical History  Procedure Laterality Date  . Cesarean section  2007 and 2011  . Cholecystectomy  2001    Dr. Michela Pitcher  . Trans gluteal drain placement      SEDATION  . Colonoscopy with propofol N/A 12/16/2015    Procedure: COLONOSCOPY WITH PROPOFOL;  Surgeon: Midge Minium, MD;  Location: [facility];  Service: Endoscopy;  Laterality: N/A;  DIABETIC/ ORAL MED/ IUD  . Colon resection N/A 01/05/2016    Procedure: HAND ASSISTED LAPAROSCOPIC COLON RESECTION;  Surgeon: Leafy Ro, MD;  Location: [facility];  Service: General;  Laterality: N/A;    Family History  Problem Relation Age of Onset  . Hyperlipidemia Mother   . Hypothyroidism Sister   . Sleep apnea Mother   . Diabetes Maternal Grandmother     No Known Allergies  Current Outpatient Prescriptions on File Prior to Visit  Medication Sig Dispense Refill  . metFORMIN (GLUCOPHAGE) 500 MG tablet Take 500 mg by mouth 2 (two) times daily with a meal.    . olmesartan-hydrochlorothiazide (BENICAR HCT) 20-12.5 MG tablet Take 1 tablet by mouth daily. (Patient taking differently: Take 1  tablet by mouth daily. AM) 90 tablet 2  . olopatadine (PATANOL) 0.1 % ophthalmic solution Place 1 drop into both eyes 2 (two) times daily. (Patient not taking: Reported on 03/20/2016) 5 mL 0   No current facility-administered medications on file prior to visit.    BP 116/72 mmHg  Pulse 91  Temp(Src) 98.3 F (36.8 C) (Oral)  Ht 5\' 2"  (1.575 m)  Wt 192 lb 12.8 oz (87.454 kg)  BMI 35.25 kg/m2  SpO2 98%    Objective:   Physical Exam  Constitutional: She is oriented to person, place, and time. She appears well-nourished.  HENT:  Right Ear: Tympanic membrane  and ear canal normal.  Left Ear: Tympanic membrane and ear canal normal.  Nose: Nose normal.  Mouth/Throat: Oropharynx is clear and moist.  Eyes: Conjunctivae and EOM are normal. Pupils are equal, round, and reactive to light.  Neck: Neck supple. No thyromegaly present.  Cardiovascular: Normal rate and regular rhythm.   No murmur heard. Pulmonary/Chest: Effort normal and breath sounds normal. She has no rales.  Abdominal: Soft. Bowel sounds are normal. There is no tenderness.  Musculoskeletal: Normal range of motion.  Lymphadenopathy:    She has no cervical adenopathy.  Neurological: She is alert and oriented to person, place, and time. She has normal reflexes. No cranial nerve deficit.  Skin: Skin is warm and dry. No rash noted.  Psychiatric: She has a normal mood and affect.          Assessment & Plan:

## 2016-03-20 NOTE — Assessment & Plan Note (Signed)
Stable in clinic today. Continue Benicar once daily.

## 2016-03-20 NOTE — Patient Instructions (Signed)
You will be contacted regarding your Mammogram.  Please let us know if you have not heard back within one week.   It is important that you improve your diet. Please limit carbohydrates in the form of white bread, rice, pasta, fast food, sugary drinks, etc. Increase your consumption of fresh fruits and vegetables, lean protein, whole grains.  Ensure you are consuming 64 ounces of water daily.  Start exercising. You should be getting 1 hour of moderate intensity exercise 5 days weekly.  Consider downloading the App "My Fitness Pal" for calorie tracking.  Schedule a lab only appointment for repeat A1C in 6 months.  Follow up in 1 year for repeat physical or sooner if needed.  It was a pleasure to see you today!

## 2016-03-20 NOTE — Progress Notes (Signed)
Pre visit review using our clinic review tool, if applicable. No additional management support is needed unless otherwise documented below in the visit note. 

## 2016-03-20 NOTE — Assessment & Plan Note (Signed)
Recent A1c is 6.2, managed on metformin 500 mg twice a day. Continue same. Recheck A1c in 6 months.

## 2016-05-29 ENCOUNTER — Other Ambulatory Visit: Payer: Self-pay | Admitting: Primary Care

## 2016-05-29 DIAGNOSIS — I10 Essential (primary) hypertension: Secondary | ICD-10-CM

## 2016-05-29 DIAGNOSIS — R7303 Prediabetes: Secondary | ICD-10-CM

## 2016-07-03 ENCOUNTER — Telehealth: Payer: Self-pay

## 2016-07-03 NOTE — Telephone Encounter (Signed)
LM to schedule pt for 5mo f/u per 01/14/16 ov note.  Will await call back.

## 2016-07-10 NOTE — Telephone Encounter (Signed)
Pt scheduled for 07-18-16 @ 11 with DR. Pt aware & voiced understanding. Nothing further needed.

## 2016-07-17 NOTE — Progress Notes (Signed)
[facility]      Assessment and Plan:  Excessive daytime sleepiness. -Sleep study 02/16/16 severe obstructive sleep apnea with AHI of 109, requiring CPAP @ 10cmH2O.   Diabetes mellitus. -Diabetes mellitus, which appears to be adequately controlled at this time, we discussed importance of treating sleep apnea as it can contribute to hyperglycemia.  Essential hypertension. -May be related to obstructive sleep apnea, as hypertension is better controlled when she sleeps well.  Date: 07/17/2016  MRN# [id number] Sofie HartiganKristie S Barrilleaux June 11, 1976  Referring Physician: Dr. Chestine Sporelark.   Sofie HartiganKristie S Filip is a 40 y.o. old female seen in consultation for chief complaint of:    Chief Complaint  Patient presents with  . Follow-up    no daytime sleepiness; feels she is doing well with CPAP; no concerns    HPI:   The patient is a 40 year old female, status post laparoscopic sigmoid colectomy and lysis of adhesions due to diverticulitis. She was last seen in the office due to severe excessive daytime sleepiness, she underwent a nocturnal polysomnogram on 02/16/16 which showed very severe progressive sleep apnea with an apnea-hypopnea index of 109. She then underwent a split night study, requiring a CPAP of 10.  She feels "completely different" she no longer falls asleep on routine activities and is no longer snoring. She is using nasal pillows. She cleans daily with a wipe and rinses out once per week.   Review of download data shows excellent compliance of 100%, average usage is 7 hours. CPAP was set at 10, with EPR of 3, residual apnea index is 2.4. Medication:    Reviewed    Allergies:  Patient has no known allergies.  Review of Systems: Gen:  Denies  fever, sweats, chills HEENT: Denies blurred vision, double vision. bleeds, sore throat Cvc:  No dizziness, chest pain. Resp:   Denies cough or sputum production, shortness of breath Ext:   No Joint pain, stiffness. Skin:  No skin rash,  hives  Endoc:  No polyuria, polydipsia. Psych: No depression, insomnia. Other:  All other systems were reviewed with the patient and were negative other that what is mentioned in the HPI.   Physical Examination:   VS: BP 132/78 (BP Location: Left Arm, Cuff Size: Normal)   Pulse 87   Ht 5\' 2"  (1.575 m)   Wt 200 lb (90.7 kg)   SpO2 100%   BMI 36.58 kg/m   General Appearance: No distress  Neuro:without focal findings,  speech normal,  HEENT: PERRLA, EOM intact.  Malimpatti 4.  Pulmonary: normal breath sounds, No wheezing.  CardiovascularNormal S1,S2.  No m/r/g.   Abdomen: Benign, Soft, non-tender. Renal:  No costovertebral tenderness  GU:  No performed at this time. Endoc: No evident thyromegaly, no signs of acromegaly. Skin:   warm, no rashes, no ecchymosis  Extremities: normal, no cyanosis, clubbing.  Other findings:    LABORATORY PANEL:   CBC No results for input(s): WBC, HGB, HCT, PLT in the last 168 hours. ------------------------------------------------------------------------------------------------------------------  Chemistries  No results for input(s): NA, K, CL, CO2, GLUCOSE, BUN, CREATININE, CALCIUM, MG, AST, ALT, ALKPHOS, BILITOT in the last 168 hours.  Invalid input(s): GFRCGP ------------------------------------------------------------------------------------------------------------------  Cardiac Enzymes No results for input(s): TROPONINI in the last 168 hours. ------------------------------------------------------------  RADIOLOGY:  No results found.     Thank  you for the consultation and for allowing [facility] to assist in the care of your patient. Our recommendations are noted above.  Please contact us if we  can be of further service.   Wells Guileseep Pola Furno, MD.  Board Certified in Internal Medicine, Pulmonary Medicine, Critical Care Medicine, and Sleep Medicine.  [place] [facility] Office Number: [phone]  Santiago Gladavid Kasa, M.D.  Stephanie AcreVishal Mungal, M.D.  Billy Fischeravid Simonds, M.D  07/17/2016

## 2016-07-18 ENCOUNTER — Encounter: Payer: Self-pay | Admitting: Internal Medicine

## 2016-07-18 ENCOUNTER — Ambulatory Visit (INDEPENDENT_AMBULATORY_CARE_PROVIDER_SITE_OTHER): Payer: BLUE CROSS/BLUE SHIELD | Admitting: Internal Medicine

## 2016-07-18 VITALS — BP 132/78 | HR 87 | Ht 62.0 in | Wt 200.0 lb

## 2016-07-18 DIAGNOSIS — G4733 Obstructive sleep apnea (adult) (pediatric): Secondary | ICD-10-CM | POA: Diagnosis not present

## 2016-07-18 DIAGNOSIS — G4719 Other hypersomnia: Secondary | ICD-10-CM | POA: Diagnosis not present

## 2016-07-18 NOTE — Patient Instructions (Addendum)
-  You're doing great on  CPAP, continue to use every night for the entire night.

## 2016-10-30 ENCOUNTER — Telehealth: Payer: Self-pay | Admitting: *Deleted

## 2016-10-30 DIAGNOSIS — Z20828 Contact with and (suspected) exposure to other viral communicable diseases: Secondary | ICD-10-CM

## 2016-10-30 MED ORDER — OSELTAMIVIR PHOSPHATE 75 MG PO CAPS: 75.0000 mg | ORAL_CAPSULE | Freq: Every day | ORAL | 0 refills | Status: DC

## 2016-10-30 NOTE — Telephone Encounter (Signed)
PT's son was diagnosed this morning with flu and wanted to know if she could get a prophylactic tamiflu rx. Please call her [phone].

## 2016-10-30 NOTE — Telephone Encounter (Signed)
Noted. Rx for Tamifllu sent into pharmacy. Take 1 capsule by mouth once daily for 7 days.

## 2016-10-30 NOTE — Telephone Encounter (Signed)
PT told rx was at pharmacy.

## 2016-11-28 IMAGING — CT CT IMAGE GUIDED FLUID DRAIN BY CATHETER
1 of 4 series · 12 of 32 positions shown, 18 images · non-contrast
Comparison: none

INDICATION: Colonic diverticular abscess.

[Series 2: routine abdomen · axial · 0.86mm/px · z∈[+300,+420]mm · 12 of 30 slices shown, 18 images]
[im 3/30  soft-tissue]
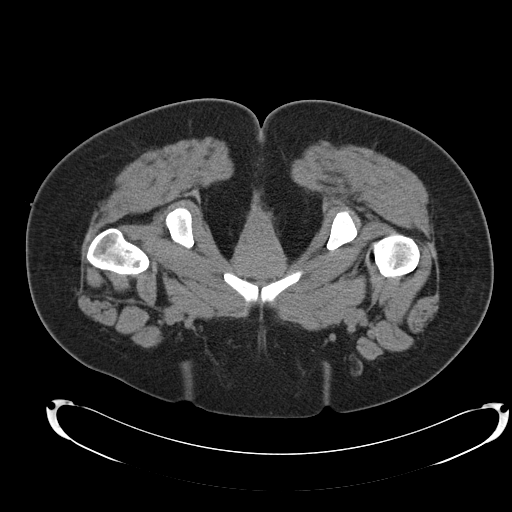
[im 3/30  bone]
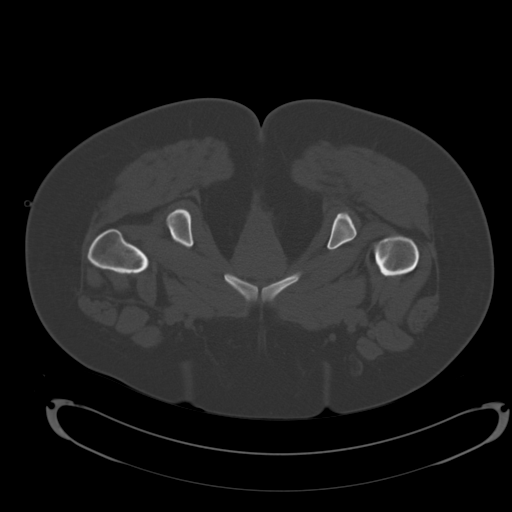
[im 5/30  soft-tissue]
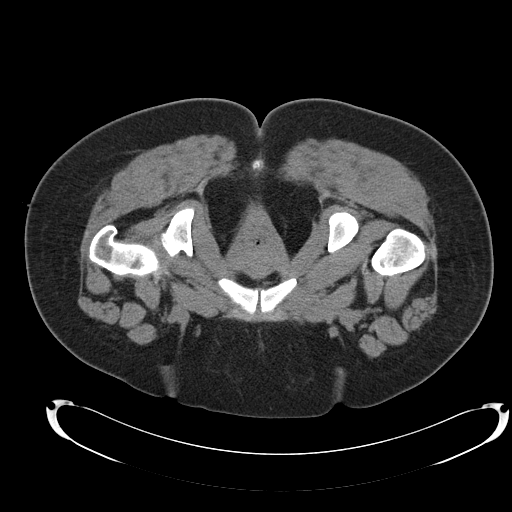
[im 7/30  soft-tissue]
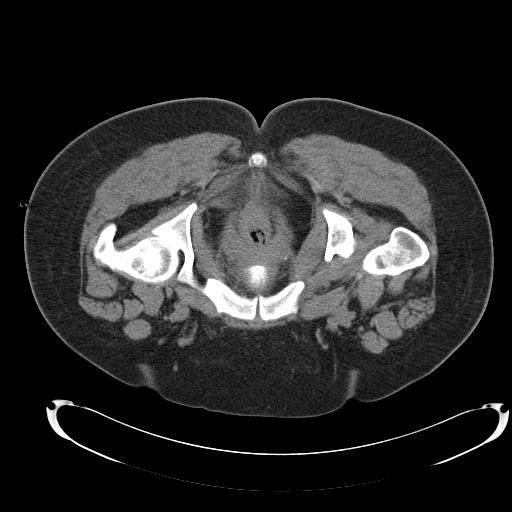
[im 9/30  soft-tissue]
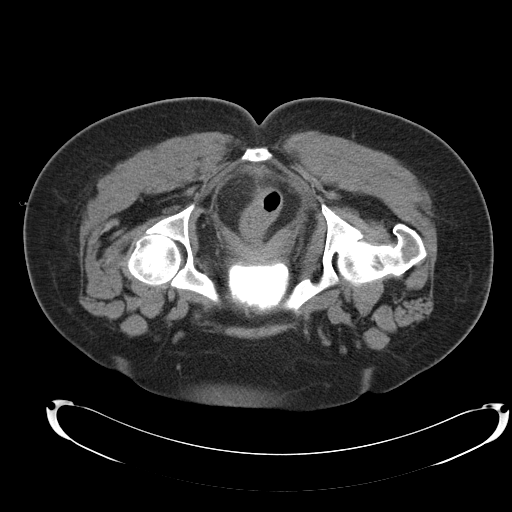
[im 12/30  soft-tissue]
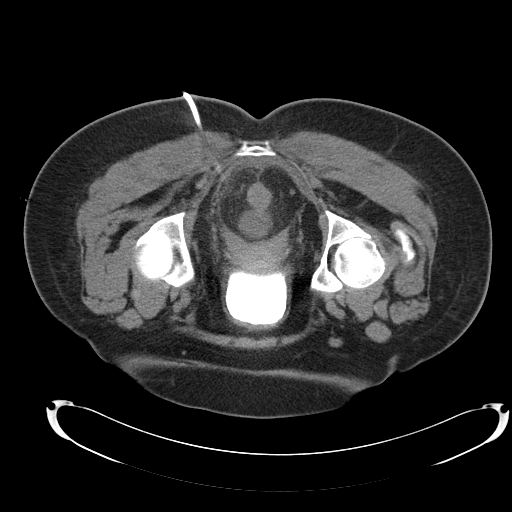
[im 14/30  soft-tissue]
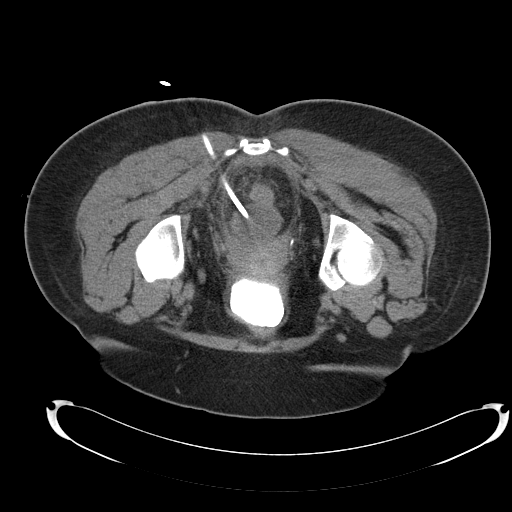
[im 16/30  soft-tissue]
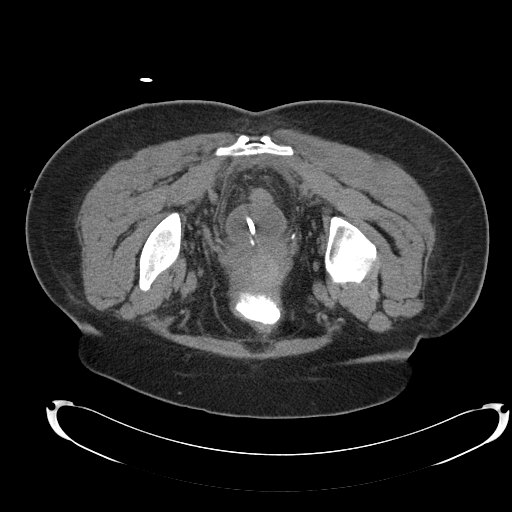
[im 18/30  soft-tissue]
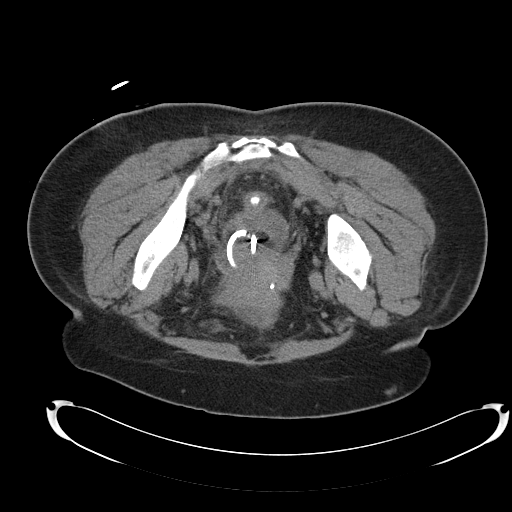
[im 21/30  soft-tissue]
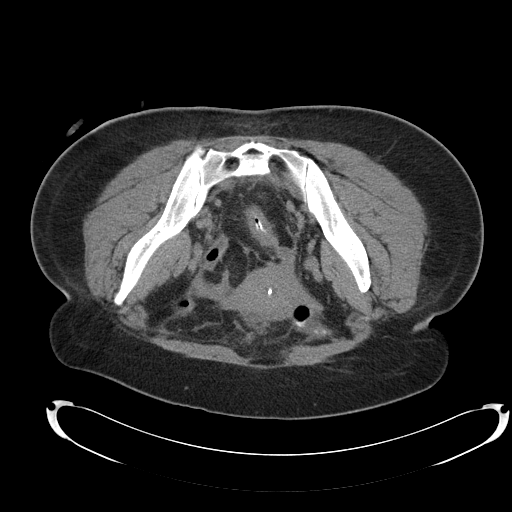
[im 21/30  lung]
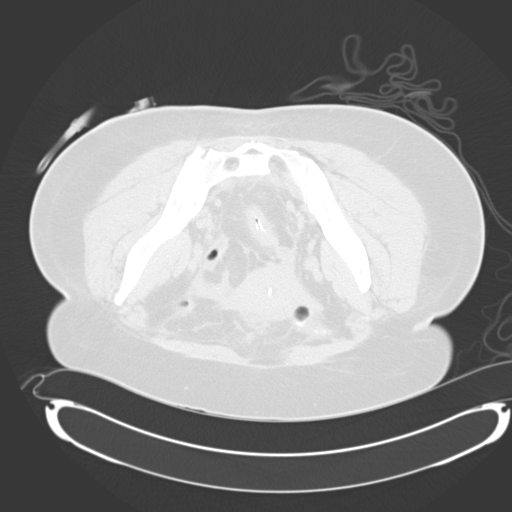
[im 21/30  bone]
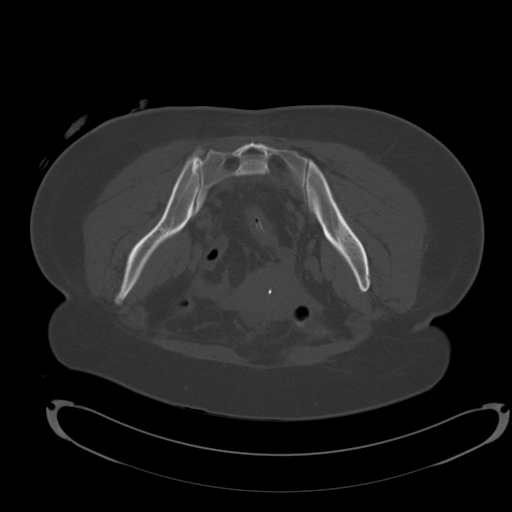
[im 23/30  soft-tissue]
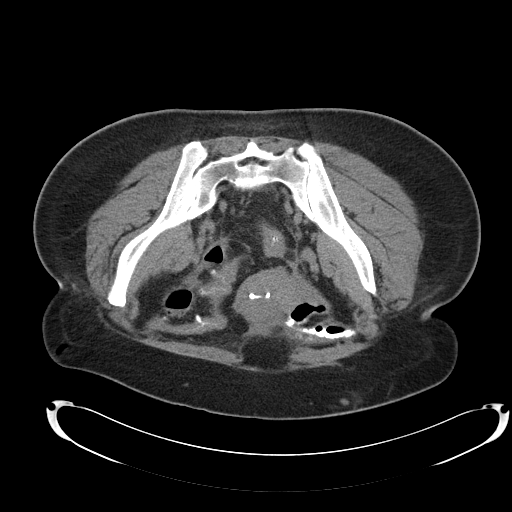
[im 23/30  lung]
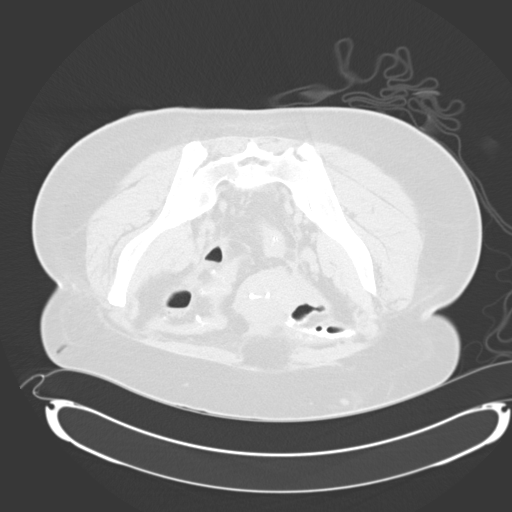
[im 25/30  soft-tissue]
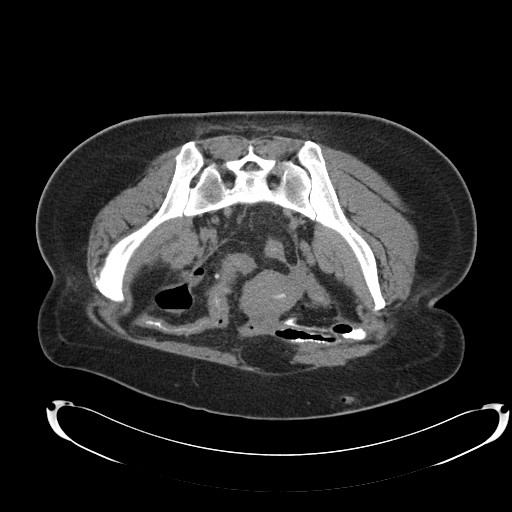
[im 25/30  lung]
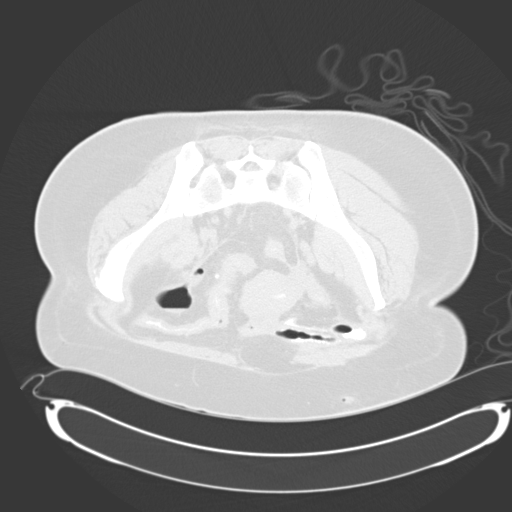
[im 27/30  soft-tissue]
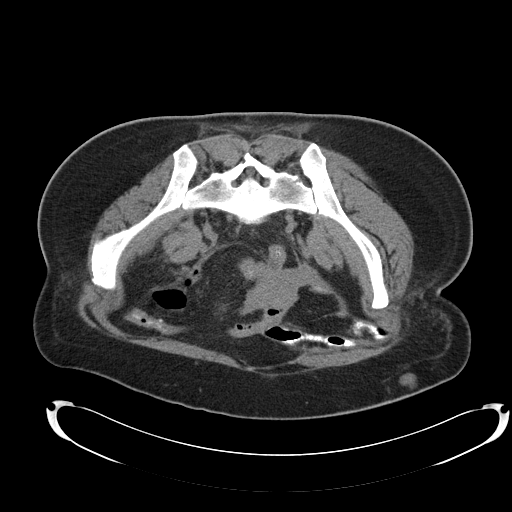
[im 27/30  lung]
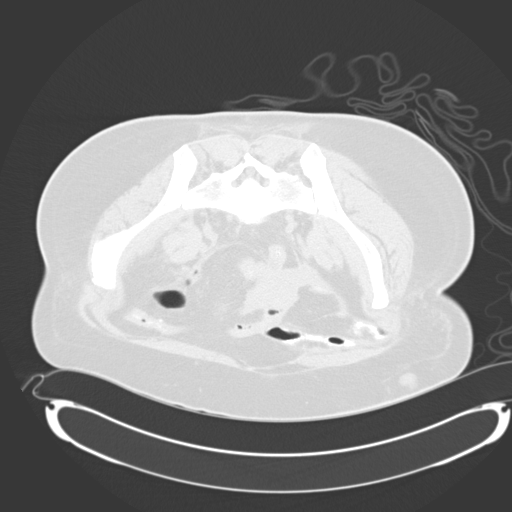

[12 of 32 positions shown; findings below may reference images not displayed]

EXAM:
CT IMAGE GUIDED FLUID DRAIN BY CATHETER

MEDICATIONS:
The patient is currently admitted to the hospital and receiving
intravenous antibiotics. The antibiotics were administered within an
appropriate time frame prior to the initiation of the procedure.

ANESTHESIA/SEDATION:
None.

The patient was continuously monitored during the procedure by the
interventional radiology nurse under my direct supervision.

COMPLICATIONS:
None immediate.

PROCEDURE:
Informed written consent was obtained from the patient after a
thorough discussion of the procedural risks, benefits and
alternatives. All questions were addressed. A timeout was performed
prior to the initiation of the procedure.

Multiple contiguous axial images were obtained of the pelvis. These
demonstrate the abscess noted on prior exam to be present, with
decreased fluid around the tip of the catheter, but larger
collection of fluid seen more inferiorly and posteriorly. Using
external traction and CT guidance, the catheter was pulled back into
the larger fluid area. Then, approximately 30 mL of thick tan fluid
was aspirated. Follow-up CT imaging demonstrates the fluid
collection to be nearly decompressed. Catheter was then externally
secured in its new position. It was hooked up to external drainage.
No immediate complications were noted.
IMPRESSION: Left trans gluteal pelvic drainage catheter was pulled back slightly
using external traction under CT guidance, an 30 mL of thick tan
fluid was removed. Follow-up imaging demonstrates the fluid
collection to be nearly completely decompressed.

## 2017-05-13 ENCOUNTER — Other Ambulatory Visit: Payer: Self-pay | Admitting: Primary Care

## 2017-05-13 DIAGNOSIS — I10 Essential (primary) hypertension: Secondary | ICD-10-CM

## 2017-06-15 ENCOUNTER — Encounter: Payer: Self-pay | Admitting: Primary Care

## 2017-07-19 ENCOUNTER — Encounter: Payer: Self-pay | Admitting: Internal Medicine

## 2017-07-19 NOTE — Progress Notes (Signed)
[facility]      Assessment and Plan:  Excessive daytime sleepiness. -Sleep study 02/16/16 severe obstructive sleep apnea with AHI of 109, requiring CPAP @ 10cmH2O.  Diabetes mellitus. -Diabetes mellitus, which appears to be adequately controlled at this time, we discussed importance of treating sleep apnea as it can contribute to hyperglycemia.  Essential hypertension. -May be related to obstructive sleep apnea, as hypertension is better controlled when she sleeps well.  Date: 07/19/2017  MRN# [id number] Maria Mcbride 1975/11/06  Referring Physician: Dr. Chestine Sporelark.   Maria Mcbride is a 41 y.o. old female seen in consultation for chief complaint of:    Chief Complaint  Patient presents with  . Sleep Apnea    pt does not feel like she is sleeping as well now as when she first started therapy. She feels like she awakens more often now. She wear every night 6-7 hours.    HPI:   The patient is a 41 year old female, status post laparoscopic sigmoid colectomy and lysis of adhesions due to diverticulitis. She was last seen in the office due to severe excessive daytime sleepiness, she underwent a nocturnal polysomnogram on 02/16/16 which showed very severe progressive sleep apnea with an apnea-hypopnea index of 109. She then underwent a split night study, requiring a CPAP of 10.  She feels better still, she is more awake during the day, she is not sleepy during the day anymore. She is no longer snoring.   Review of download data for 30 days as of 07/19/17; usage greater than 4 hours is 97% of days.  Average usage on days used 7 hours 13 minutes.  Set pressure is 10.  Residual AHI 0.7.  Medication:    Reviewed    Allergies:  Patient has no known allergies.  Review of Systems: Gen:  Denies  fever, sweats, chills HEENT: Denies blurred vision, double vision. bleeds, sore throat Cvc:  No dizziness, chest pain. Resp:   Denies cough or sputum production,  shortness of breath Ext:   No Joint pain, stiffness. Skin: No skin rash,  hives  Endoc:  No polyuria, polydipsia. Psych: No depression, insomnia. Other:  All other systems were reviewed with the patient and were negative other that what is mentioned in the HPI.   Physical Examination:   VS: BP 124/76 (BP Location: Left Arm, Cuff Size: Normal)   Pulse (!) 107   Resp 16   Ht 5\' 2"  (1.575 m)   Wt 199 lb (90.3 kg)   SpO2 98%   BMI 36.40 kg/m   General Appearance: No distress  Neuro:without focal findings,  speech normal,  HEENT: PERRLA, EOM intact.  Malimpatti 4.  Pulmonary: normal breath sounds, No wheezing.  CardiovascularNormal S1,S2.  No m/r/g.   Abdomen: Benign, Soft, non-tender. Renal:  No costovertebral tenderness  GU:  No performed at this time. Endoc: No evident thyromegaly, no signs of acromegaly. Skin:   warm, no rashes, no ecchymosis  Extremities: normal, no cyanosis, clubbing.  Other findings:    LABORATORY PANEL:   CBC No results for input(s): WBC, HGB, HCT, PLT in the last 168 hours. ------------------------------------------------------------------------------------------------------------------  Chemistries  No results for input(s): NA, K, CL, CO2, GLUCOSE, BUN, CREATININE, CALCIUM, MG, AST, ALT, ALKPHOS, BILITOT in the last 168 hours.  Invalid input(s): GFRCGP ------------------------------------------------------------------------------------------------------------------  Cardiac Enzymes No results for input(s): TROPONINI in the last 168 hours. ------------------------------------------------------------  RADIOLOGY:  No results found.     Thank  you for the consultation and for  allowing [facility] to assist in the care of your patient. Our recommendations are noted above.  Please contact us if we can be of further service.   Wells Guileseep Kevron Patella, MD.  Board Certified in Internal Medicine, Pulmonary Medicine, Critical  Care Medicine, and Sleep Medicine.  [place] [facility] Office Number: [phone]  Santiago Gladavid Kasa, M.D.  Billy Fischeravid Simonds, M.D  07/19/2017

## 2017-07-20 ENCOUNTER — Ambulatory Visit (INDEPENDENT_AMBULATORY_CARE_PROVIDER_SITE_OTHER): Payer: BLUE CROSS/BLUE SHIELD | Admitting: Internal Medicine

## 2017-07-20 ENCOUNTER — Encounter: Payer: Self-pay | Admitting: Internal Medicine

## 2017-07-20 VITALS — BP 124/76 | HR 107 | Resp 16 | Ht 62.0 in | Wt 199.0 lb

## 2017-07-20 DIAGNOSIS — G4719 Other hypersomnia: Secondary | ICD-10-CM

## 2017-07-20 DIAGNOSIS — G4733 Obstructive sleep apnea (adult) (pediatric): Secondary | ICD-10-CM | POA: Diagnosis not present

## 2017-07-20 NOTE — Patient Instructions (Signed)
--  You are doing great with your CPAP, continue to use it every night.

## 2017-07-27 ENCOUNTER — Encounter: Payer: Self-pay | Admitting: Primary Care

## 2017-07-30 ENCOUNTER — Ambulatory Visit (INDEPENDENT_AMBULATORY_CARE_PROVIDER_SITE_OTHER): Payer: BLUE CROSS/BLUE SHIELD | Admitting: Primary Care

## 2017-07-30 VITALS — BP 118/70 | HR 76 | Temp 98.2°F | Ht 62.0 in | Wt 200.8 lb

## 2017-07-30 DIAGNOSIS — Z1231 Encounter for screening mammogram for malignant neoplasm of breast: Secondary | ICD-10-CM

## 2017-07-30 DIAGNOSIS — R7303 Prediabetes: Secondary | ICD-10-CM | POA: Diagnosis not present

## 2017-07-30 DIAGNOSIS — Z1239 Encounter for other screening for malignant neoplasm of breast: Secondary | ICD-10-CM

## 2017-07-30 DIAGNOSIS — L659 Nonscarring hair loss, unspecified: Secondary | ICD-10-CM | POA: Diagnosis not present

## 2017-07-30 DIAGNOSIS — Z0000 Encounter for general adult medical examination without abnormal findings: Secondary | ICD-10-CM

## 2017-07-30 DIAGNOSIS — I10 Essential (primary) hypertension: Secondary | ICD-10-CM | POA: Diagnosis not present

## 2017-07-30 LAB — COMPREHENSIVE METABOLIC PANEL
ALT: 23 U/L (ref 0–35)
AST: 18 U/L (ref 0–37)
Albumin: 4.2 g/dL (ref 3.5–5.2)
Alkaline Phosphatase: 54 U/L (ref 39–117)
BILIRUBIN TOTAL: 0.7 mg/dL (ref 0.2–1.2)
BUN: 11 mg/dL (ref 6–23)
CHLORIDE: 99 meq/L (ref 96–112)
CO2: 30 meq/L (ref 19–32)
CREATININE: 0.77 mg/dL (ref 0.40–1.20)
Calcium: 9.4 mg/dL (ref 8.4–10.5)
GFR: 87.21 mL/min (ref >60.00)
GLUCOSE: 163 mg/dL — AB (ref 70–99)
Potassium: 3.9 mEq/L (ref 3.5–5.1)
Sodium: 137 mEq/L (ref 135–145)
Total Protein: 7.1 g/dL (ref 6.0–8.3)

## 2017-07-30 LAB — LIPID PANEL
CHOL/HDL RATIO: 4
Cholesterol: 155 mg/dL (ref 0–200)
HDL: 42.9 mg/dL (ref >39.00)
NONHDL: 111.88
Triglycerides: 225 mg/dL — ABNORMAL HIGH (ref 0.0–149.0)
VLDL: 45 mg/dL — ABNORMAL HIGH (ref 0.0–40.0)

## 2017-07-30 LAB — TSH: TSH: 4.31 u[IU]/mL (ref 0.35–4.50)

## 2017-07-30 LAB — HEMOGLOBIN A1C: HEMOGLOBIN A1C: 7.3 % — AB (ref 4.6–6.5)

## 2017-07-30 LAB — LDL CHOLESTEROL, DIRECT: LDL DIRECT: 84 mg/dL

## 2017-07-30 LAB — VITAMIN D 25 HYDROXY (VIT D DEFICIENCY, FRACTURES): VITD: 25.47 ng/mL — ABNORMAL LOW (ref 30.00–100.00)

## 2017-07-30 MED ORDER — METFORMIN HCL ER 500 MG PO TB24: 1000.0000 mg | ORAL_TABLET | Freq: Every day | ORAL | 3 refills | Status: DC

## 2017-07-30 MED ORDER — OLMESARTAN MEDOXOMIL-HCTZ 20-12.5 MG PO TABS: 1.0000 | ORAL_TABLET | Freq: Every day | ORAL | 3 refills | Status: DC

## 2017-07-30 NOTE — Assessment & Plan Note (Signed)
Immunizations UTD. Pap smear UTD. Mammogram due, pending. Discussed the importance of a healthy diet and regular exercise in order for weight loss, and to reduce the risk of other medical problems. Exam as noted. Labs pending. Follow up in 1 year.

## 2017-07-30 NOTE — Assessment & Plan Note (Signed)
A1C of 6.2 in June 2017. Continue Metformin XR 1000 mg once daily, refills sent to pharmacy.

## 2017-07-30 NOTE — Progress Notes (Signed)
Subjective:    Patient ID: Maria Mcbride, female    DOB: [birth year], 41 y.o.   MRN: [id number]  HPI  Maria Mcbride is a 41 year old female who presents today for complete physical.  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 Dental exam: Completes annually Colonoscopy: Completed in April 2017 Pap Smear: Completed in 2016 Mammogram: Never completed.    Review of Systems  Constitutional: Negative for unexpected weight change.  HENT: Negative for rhinorrhea.   Respiratory: Negative for cough and shortness of breath.   Cardiovascular: Negative for chest pain.  Gastrointestinal: Negative for constipation and diarrhea.  Genitourinary: Negative for difficulty urinating and menstrual problem.  Musculoskeletal: Negative for arthralgias and myalgias.  Skin: Negative for rash.       Hair loss since April 2017. Yellow discoloration to toenails.  Allergic/Immunologic: Negative for environmental allergies.  Neurological: Negative for dizziness, numbness and headaches.  Psychiatric/Behavioral:       Denies concerns for anxiety and depression       Past Medical History:  Diagnosis Date  . Diabetes mellitus without complication (HCC)    BORDERLINE  . Diverticulitis   . GERD (gastroesophageal reflux disease)   . Hypertension    CONTROLLED ON MEDS  . Pneumonia FEB 26,2017   DR SAYS CLEAR  . Sleep apnea    PATIENT [facility]IN[facility] MAY HAVE/ [facility]     Social History   Socioeconomic History  . Marital status: Married    Spouse name: Not on file  . Number of children: Not on file  . Years of education: Not on file  . Highest education level: Not on file  Social Needs  .  Financial resource strain: Not on file  . Food insecurity - worry: Not on file  . Food insecurity - inability: Not on file  . Transportation needs - medical: Not on file  . Transportation needs - non-medical: Not on file  Occupational History  . Not on file  Tobacco Use  . Smoking status: Never Smoker  . Smokeless tobacco: Never Used  Substance and Sexual Activity  . Alcohol use: No  . Drug use: No  . Sexual activity: Not on file  Other Topics Concern  . Not on file  Social History Narrative   Married.   2 children.    Works as a Teacher, early years/prepharmacist.   Enjoys camping, being out doors, Dance movement psychotherapistwater skiiing.     Past Surgical History:  Procedure Laterality Date  . CESAREAN SECTION  2007 and 2011  . CHOLECYSTECTOMY  2001   Dr. Michela Mcbride  . COLONOSCOPY WITH PROPOFOL N/A 12/16/2015   Performed by Midge MiniumWohl, Maria Mcbride [facility]  . HAND ASSISTED LAPAROSCOPIC COLON RESECTION N/A 01/05/2016   Performed by Maria Mcbride, [facility]  . TRANS GLUTEAL DRAIN PLACEMENT     SEDATION    Family History  Problem Relation Age of Onset  . Hyperlipidemia Mother   . Sleep apnea Mother   . Hypothyroidism Sister   . Diabetes Maternal Grandmother     No Known Allergies  Current Outpatient Medications on File Prior to Visit  Medication Sig Dispense Refill  . metFORMIN (GLUCOPHAGE-XR) 500 MG 24 hr tablet TAKE ONE TABLET  BY MOUTH TWICE DAILY 180 tablet 1  . olmesartan-hydrochlorothiazide (BENICAR HCT) 20-12.5 MG tablet TAKE ONE TABLET BY MOUTH ONCE DAILY 90 tablet 0  . olopatadine (PATANOL) 0.1 % ophthalmic solution Place 1 drop into both eyes 2 (two) times daily. 5 mL 0   No current facility-administered medications on file prior to visit.     BP 118/70   Pulse 76   Temp 98.2 F (36.8 C) (Oral)   Ht 5\' 2"  (1.575 m)   Wt 200 lb 12.8 oz (91.1 kg)   SpO2 98%   BMI 36.73 kg/m    Objective:   Physical Exam  Constitutional: She is oriented to person, place, and time. She appears  well-nourished.  HENT:  Right Ear: Tympanic membrane and ear canal normal.  Left Ear: Tympanic membrane and ear canal normal.  Nose: Nose normal.  Mouth/Throat: Oropharynx is clear and moist.  Eyes: Conjunctivae and EOM are normal. Pupils are equal, round, and reactive to light.  Neck: Neck supple. No thyromegaly present.  Cardiovascular: Normal rate and regular rhythm.  No murmur heard. Pulmonary/Chest: Effort normal and breath sounds normal. She has no rales.  Abdominal: Soft. Bowel sounds are normal. There is no tenderness.  Musculoskeletal: Normal range of motion.  Lymphadenopathy:    She has no cervical adenopathy.  Neurological: She is alert and oriented to person, place, and time. She has normal reflexes. No cranial nerve deficit.  Skin: Skin is warm and dry. No rash noted.  Yellow discoloration half way up nail beds to bilateral great toenails.  Psychiatric: She has a normal mood and affect.          Assessment & Plan:

## 2017-07-30 NOTE — Patient Instructions (Signed)
Complete lab work prior to leaving today. I will notify you of your results once received.   Start exercising. You should be getting 150 minutes of moderate intensity exercise weekly.  Increase vegetables, fruit, whole grains, lean protein. Limit sweet tea. Ensure you are consuming 64 ounces of water daily.  Please message me if no improvement in the fungal infection of your toes.  It was a pleasure to see you today!

## 2017-07-30 NOTE — Assessment & Plan Note (Signed)
BP stable in the office today. Continue Benicar, refills sent to pharmacy. BMP pending.

## 2017-08-08 LAB — HM DIABETES EYE EXAM

## 2017-08-23 ENCOUNTER — Ambulatory Visit
Admission: RE | Admit: 2017-08-23 | Discharge: 2017-08-23 | Disposition: A | Discharge status: Home / Self Care | Payer: BLUE CROSS/BLUE SHIELD | Source: Ambulatory Visit | Attending: Primary Care | Admitting: Primary Care

## 2017-08-23 DIAGNOSIS — Z1239 Encounter for other screening for malignant neoplasm of breast: Secondary | ICD-10-CM

## 2017-08-23 DIAGNOSIS — Z1231 Encounter for screening mammogram for malignant neoplasm of breast: Secondary | ICD-10-CM | POA: Diagnosis present

## 2017-10-16 ENCOUNTER — Encounter: Payer: Self-pay | Admitting: Primary Care

## 2017-12-10 ENCOUNTER — Other Ambulatory Visit: Payer: Self-pay | Admitting: Primary Care

## 2017-12-10 DIAGNOSIS — J302 Other seasonal allergic rhinitis: Secondary | ICD-10-CM

## 2018-04-30 ENCOUNTER — Other Ambulatory Visit: Payer: Self-pay | Admitting: Primary Care

## 2018-04-30 DIAGNOSIS — I10 Essential (primary) hypertension: Secondary | ICD-10-CM

## 2018-04-30 DIAGNOSIS — R7303 Prediabetes: Secondary | ICD-10-CM

## 2018-07-24 ENCOUNTER — Other Ambulatory Visit: Payer: Self-pay | Admitting: Primary Care

## 2018-07-24 DIAGNOSIS — R7303 Prediabetes: Secondary | ICD-10-CM

## 2018-08-23 ENCOUNTER — Encounter: Payer: Self-pay | Admitting: Internal Medicine

## 2018-08-23 ENCOUNTER — Ambulatory Visit (INDEPENDENT_AMBULATORY_CARE_PROVIDER_SITE_OTHER): Payer: BLUE CROSS/BLUE SHIELD | Admitting: Internal Medicine

## 2018-08-23 VITALS — BP 106/70 | HR 74 | Resp 16 | Ht 62.0 in | Wt 192.0 lb

## 2018-08-23 DIAGNOSIS — G4733 Obstructive sleep apnea (adult) (pediatric): Secondary | ICD-10-CM | POA: Diagnosis not present

## 2018-08-23 NOTE — Progress Notes (Signed)
[facility]      Assessment and Plan:  Obstructive sleep apnea. -Sleep study 02/16/16 severe obstructive sleep apnea with AHI of 109, requiring CPAP @ 10cmH2O. she has very severe obstructive sleep apnea and is at high risk for complications without treatment. - Review of download shows excellent control obstructive sleep apnea as well as excellent compliance.  She is recommended to continue nightly use of CPAP.  Diabetes mellitus. -Diabetes mellitus, which appears to be adequately controlled at this time, we discussed importance of treating sleep apnea as it can contribute to hyperglycemia.  Essential hypertension. -May be related to obstructive sleep apnea, as hypertension is better controlled when she sleeps well.  Return in about 1 year (around 08/24/2019).  Greater than 50% of the 25 minute visit was spent in counseling/coordination of care regarding sleep apnea and machine/ mask care.    Date: 08/23/2018  MRN# [id number] Sofie HartiganKristie S Mosley September 05, 1976    Sofie HartiganKristie S Reyez is a 42 y.o. old female seen in consultation for chief complaint of:    Chief Complaint  Patient presents with  . Sleep Apnea    pt doing well on cpap therapy this is just routine f/u.    HPI:   The patient is a 42 year old female, status post laparoscopic sigmoid colectomy and lysis of adhesions due to diverticulitis. She was last seen in the office due to severe excessive daytime sleepiness, she underwent a nocturnal polysomnogram on 02/16/16 which showed very severe progressive sleep apnea with an apnea-hypopnea index of 109. She then underwent a split night study, requiring a CPAP of 10.  She feels that she is doing well with her CPAP, she is using it every night and feeling more rested during the day. She is using SoClean to clean her CPAP.  She changes her tubing and mask about every 6 months.  **Review of CPAP download data from 07/23/18-08/21/2018>> usage greater than 4 hours  30/30 days.  Average usage on days used is 6 hours 52 minutes.  Set pressure is 10.  Leaks are within normal limits.  Residual AHI is 0.5.  Overall this shows excellent compliance with CPAP with excellent control of obstructive sleep apnea. Review of download data for 30 days as of 07/19/17; usage greater than 4 hours is 97% of days.  Average usage on days used 7 hours 13 minutes.  Set pressure is 10.  Residual AHI 0.7. **Sleep study 02/16/16>> severe obstructive sleep apnea with AHI of 109, requiring CPAP @ 10cmH2O.  Medication:    Current Outpatient Medications:  .  metFORMIN (GLUCOPHAGE-XR) 500 MG 24 hr tablet, Take 2 tablets (1,000 mg total) by mouth daily with breakfast. NEED APPOINTMENT FOR ANY MORE REFILLS, Disp: 60 tablet, Rfl: 0 .  olmesartan-hydrochlorothiazide (BENICAR HCT) 20-12.5 MG tablet, TAKE 1 TABLET BY MOUTH ONCE DAILY, Disp: 90 tablet, Rfl: 1 .  olopatadine (PATANOL) 0.1 % ophthalmic solution, INSTILL ONE DROP INTO EACH EYE TWICE DAILY, Disp: 5 mL, Rfl: 2    Allergies:  Patient has no known allergies.  Review of Systems:  Constitutional: Feels well. Cardiovascular: Denies chest pain, exertional chest pain.  Pulmonary: Denies hemoptysis, pleuritic chest pain.   The remainder of systems were reviewed and were found to be negative other than what is documented in the HPI.    Physical Examination:   VS: BP 106/70 (BP Location: Left Arm, Cuff Size: Normal)   Pulse 74   Resp 16   Ht 5\' 2"  (1.575 m)   Wt  192 lb (87.1 kg)   SpO2 96%   BMI 35.12 kg/m   General Appearance: No distress  Neuro:without focal findings, mental status, speech normal, alert and oriented HEENT: PERRLA, EOM intact Pulmonary: No wheezing, No rales  CardiovascularNormal S1,S2.  No m/r/g.  Abdomen: Benign, Soft, non-tender, No masses Renal:  No costovertebral tenderness  GU:  No performed at this time. Endoc: No evident thyromegaly, no signs of acromegaly or Cushing features Skin:   warm, no  rashes, no ecchymosis  Extremities: normal, no cyanosis, clubbing.      LABORATORY PANEL:   CBC No results for input(s): WBC, HGB, HCT, PLT in the last 168 hours. ------------------------------------------------------------------------------------------------------------------  Chemistries  No results for input(s): NA, K, CL, CO2, GLUCOSE, BUN, CREATININE, CALCIUM, MG, AST, ALT, ALKPHOS, BILITOT in the last 168 hours.  Invalid input(s): GFRCGP ------------------------------------------------------------------------------------------------------------------  Cardiac Enzymes No results for input(s): TROPONINI in the last 168 hours. ------------------------------------------------------------  RADIOLOGY:  No results found.    Wells Guiles, M.D., F.C.C.P.  Board Certified in Internal Medicine, Pulmonary Medicine, Critical Care Medicine, and Sleep Medicine.  [place] [facility] Office Number: [phone]    08/23/2018

## 2018-08-23 NOTE — Patient Instructions (Signed)
Continue using CPAP every night.  

## 2018-09-27 ENCOUNTER — Other Ambulatory Visit: Payer: BLUE CROSS/BLUE SHIELD

## 2018-09-29 ENCOUNTER — Other Ambulatory Visit: Payer: Self-pay | Admitting: Primary Care

## 2018-09-29 DIAGNOSIS — E785 Hyperlipidemia, unspecified: Secondary | ICD-10-CM

## 2018-09-29 DIAGNOSIS — I10 Essential (primary) hypertension: Secondary | ICD-10-CM

## 2018-09-29 DIAGNOSIS — E119 Type 2 diabetes mellitus without complications: Secondary | ICD-10-CM

## 2018-09-30 ENCOUNTER — Other Ambulatory Visit (INDEPENDENT_AMBULATORY_CARE_PROVIDER_SITE_OTHER): Payer: BLUE CROSS/BLUE SHIELD

## 2018-09-30 DIAGNOSIS — E785 Hyperlipidemia, unspecified: Secondary | ICD-10-CM | POA: Diagnosis not present

## 2018-09-30 DIAGNOSIS — E119 Type 2 diabetes mellitus without complications: Secondary | ICD-10-CM | POA: Diagnosis not present

## 2018-09-30 DIAGNOSIS — I10 Essential (primary) hypertension: Secondary | ICD-10-CM

## 2018-09-30 LAB — HEMOGLOBIN A1C: HEMOGLOBIN A1C: 9.5 % — AB (ref 4.6–6.5)

## 2018-09-30 LAB — LIPID PANEL
CHOL/HDL RATIO: 4
Cholesterol: 160 mg/dL (ref 0–200)
HDL: 41.2 mg/dL (ref >39.00)
NONHDL: 118.68
Triglycerides: 213 mg/dL — ABNORMAL HIGH (ref 0.0–149.0)
VLDL: 42.6 mg/dL — AB (ref 0.0–40.0)

## 2018-09-30 LAB — COMPREHENSIVE METABOLIC PANEL
ALK PHOS: 73 U/L (ref 39–117)
ALT: 28 U/L (ref 0–35)
AST: 19 U/L (ref 0–37)
Albumin: 4.2 g/dL (ref 3.5–5.2)
BUN: 9 mg/dL (ref 6–23)
CHLORIDE: 96 meq/L (ref 96–112)
CO2: 30 meq/L (ref 19–32)
Calcium: 9.6 mg/dL (ref 8.4–10.5)
Creatinine, Ser: 0.86 mg/dL (ref 0.40–1.20)
GFR: 71.82 mL/min (ref >60.00)
GLUCOSE: 266 mg/dL — AB (ref 70–99)
POTASSIUM: 3.7 meq/L (ref 3.5–5.1)
SODIUM: 135 meq/L (ref 135–145)
Total Bilirubin: 0.5 mg/dL (ref 0.2–1.2)
Total Protein: 7.2 g/dL (ref 6.0–8.3)

## 2018-09-30 LAB — LDL CHOLESTEROL, DIRECT: LDL DIRECT: 95 mg/dL

## 2018-10-04 ENCOUNTER — Encounter: Payer: Self-pay | Admitting: Primary Care

## 2018-10-04 ENCOUNTER — Ambulatory Visit (INDEPENDENT_AMBULATORY_CARE_PROVIDER_SITE_OTHER): Payer: BLUE CROSS/BLUE SHIELD | Admitting: Primary Care

## 2018-10-04 VITALS — BP 108/72 | HR 78 | Temp 98.6°F | Ht 62.0 in | Wt 188.5 lb

## 2018-10-04 DIAGNOSIS — Z0000 Encounter for general adult medical examination without abnormal findings: Secondary | ICD-10-CM | POA: Diagnosis not present

## 2018-10-04 DIAGNOSIS — E119 Type 2 diabetes mellitus without complications: Secondary | ICD-10-CM

## 2018-10-04 DIAGNOSIS — I10 Essential (primary) hypertension: Secondary | ICD-10-CM | POA: Diagnosis not present

## 2018-10-04 DIAGNOSIS — R7303 Prediabetes: Secondary | ICD-10-CM

## 2018-10-04 DIAGNOSIS — Z1239 Encounter for other screening for malignant neoplasm of breast: Secondary | ICD-10-CM

## 2018-10-04 DIAGNOSIS — K5732 Diverticulitis of large intestine without perforation or abscess without bleeding: Secondary | ICD-10-CM

## 2018-10-04 MED ORDER — METFORMIN HCL ER 500 MG PO TB24: 500.0000 mg | ORAL_TABLET | Freq: Every day | ORAL | 3 refills | Status: DC

## 2018-10-04 MED ORDER — OLMESARTAN MEDOXOMIL-HCTZ 20-12.5 MG PO TABS: 1.0000 | ORAL_TABLET | Freq: Every day | ORAL | 3 refills | Status: DC

## 2018-10-04 MED ORDER — DULAGLUTIDE 0.75 MG/0.5ML ~~LOC~~ SOAJ: SUBCUTANEOUS | 5 refills | Status: DC

## 2018-10-04 NOTE — Assessment & Plan Note (Signed)
No recent symptoms. When she does notice symptoms she will go on a clear liquid diet for 2 days with resolve.

## 2018-10-04 NOTE — Assessment & Plan Note (Signed)
Immunizations UTD.  Pap smear due, she will see GYN. Mammogram due, ordered and pending. Discussed the importance of a healthy diet and regular exercise in order for weight loss, and to reduce the risk of any potential medical problems. Exam unremarkable. Labs reviewed.  Follow up in 1 year for CPE.

## 2018-10-04 NOTE — Progress Notes (Signed)
Subjective:    Patient ID: Maria Mcbride, female    DOB: [birth year], 44 y.o.   MRN: [id number]  HPI  Maria Mcbride is a 43 year old female who presents today for complete physical.  She's been checking her glucose readings every morning for the last several weeks and is getting readings of:  AM fasting: 200's-250's  Immunizations: -Tetanus: Completed in 2017 -Influenza: Completed this season    Diet: She endorses a poor diet. Breakfast: Fast food, fruit, toast, cereal  Lunch: Sandwich, pasta, grilled chicken Dinner: Fast food, take out food  Snacks: None Desserts: Twice weekly  Beverages: Sweet tea, water, flavored water, coffee  Exercise: She is not exercising Eye exam: Completed in 2019 Dental exam: Completes semi-annually  Pap Smear: Completed over three years ago. She will schedule an appointment with GYN. Mammogram: Completed in 2018  BP Readings from Last 3 Encounters:  10/04/18 108/72  08/23/18 106/70  07/30/17 118/70   Wt Readings from Last 3 Encounters:  10/04/18 188 lb 8 oz (85.5 kg)  08/23/18 192 lb (87.1 kg)  07/30/17 200 lb 12.8 oz (91.1 kg)      Review of Systems  Constitutional: Negative for unexpected weight change.  HENT: Negative for rhinorrhea.   Respiratory: Negative for cough and shortness of breath.   Cardiovascular: Negative for chest pain.  Gastrointestinal: Negative for constipation and diarrhea.  Genitourinary: Negative for difficulty urinating.       Occasional menstrual cycles with IUD. IUD is due to come out next month.  Musculoskeletal: Negative for arthralgias and myalgias.  Skin: Negative for rash.  Allergic/Immunologic: Negative for environmental allergies.  Neurological: Negative for dizziness, numbness and headaches.  Psychiatric/Behavioral:       Some anxiety with work, overall working on handling herself.    The 10-year ASCVD risk score Denman George DC Montez Hageman., et al., 2013) is: 1.4%   Values used to calculate the score:     Age: 18  years     Sex: Female     Is Non-Hispanic African American: No     Diabetic: Yes     Tobacco smoker: No     Systolic Blood Pressure: 108 mmHg     Is BP treated: Yes     HDL Cholesterol: 41.2 mg/dL     Total Cholesterol: 160 mg/dL      Past Medical History:  Diagnosis Date  . Abscess of abdominal cavity (HCC)   . Diabetes mellitus without complication (HCC)    BORDERLINE  . Diverticulitis   . Diverticulitis large intestine 01/05/2016  . Diverticulitis of large intestine without perforation or abscess without bleeding   . GERD (gastroesophageal reflux disease)   . Hypertension    CONTROLLED ON MEDS  . Pneumonia FEB 26,2017   DR SAYS CLEAR  . Sleep apnea    PATIENT THINKS MAY HAVE/ HAS APPT WITH PULMONARY DR AT [facility]     Social History   Socioeconomic History  . Marital status: Married    Spouse name: Not on file  . Number of children: Not on file  . Years of education: Not on file  . Highest education level: Not on file  Occupational History  . Not on file  Social Needs  . Financial resource strain: Not on file  . Food insecurity:    Worry: Not on file    Inability: Not on file  . Transportation needs:    Medical: Not on file    Non-medical: Not on file  Tobacco Use  .  Smoking status: Never Smoker  . Smokeless tobacco: Never Used  Substance and Sexual Activity  . Alcohol use: No  . Drug use: No  . Sexual activity: Not on file  Lifestyle  . Physical activity:    Days per week: Not on file    Minutes per session: Not on file  . Stress: Not on file  Relationships  . Social connections:    Talks on phone: Not on file    Gets together: Not on file    Attends religious service: Not on file    Active member of club or organization: Not on file    Attends meetings of clubs or organizations: Not on file    Relationship status: Not on file  . Intimate partner violence:    Fear of current or ex partner: Not on file    Emotionally abused: Not on file    Physically  abused: Not on file    Forced sexual activity: Not on file  Other Topics Concern  . Not on file  Social History Narrative   Married.   2 children.    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     Past Surgical History:  Procedure Laterality Date  . CESAREAN SECTION  2007 and 2011  . CHOLECYSTECTOMY  2001   Dr. Michela Pitcher  . COLON RESECTION N/A 01/05/2016   Procedure: HAND ASSISTED LAPAROSCOPIC COLON RESECTION;  Surgeon: Leafy Ro, MD;  Location: [facility];  Service: General;  Laterality: N/A;  . COLONOSCOPY WITH PROPOFOL N/A 12/16/2015   Procedure: COLONOSCOPY WITH PROPOFOL;  Surgeon: Midge Minium, MD;  Location: [facility];  Service: Endoscopy;  Laterality: N/A;  DIABETIC/ ORAL MED/ IUD  . TRANS GLUTEAL DRAIN PLACEMENT     SEDATION    Family History  Problem Relation Age of Onset  . Hyperlipidemia Mother   . Sleep apnea Mother   . Hypothyroidism Sister   . Diabetes Maternal Grandmother   . Breast cancer Neg Hx     No Known Allergies  Current Outpatient Medications on File Prior to Visit  Medication Sig Dispense Refill  . olopatadine (PATANOL) 0.1 % ophthalmic solution INSTILL ONE DROP INTO EACH EYE TWICE DAILY 5 mL 2   No current facility-administered medications on file prior to visit.     BP 108/72   Pulse 78   Temp 98.6 F (37 C) (Oral)   Ht 5\' 2"  (1.575 m)   Wt 188 lb 8 oz (85.5 kg)   SpO2 96%   BMI 34.48 kg/m    Objective:   Physical Exam  Constitutional: She is oriented to person, place, and time. She appears well-nourished.  HENT:  Mouth/Throat: No oropharyngeal exudate.  Eyes: Pupils are equal, round, and reactive to light. EOM are normal.  Neck: Neck supple. No thyromegaly present.  Cardiovascular: Normal rate and regular rhythm.  Respiratory: Effort normal and breath sounds normal.  GI: Soft. Bowel sounds are normal. There is no abdominal tenderness.  Musculoskeletal: Normal range of motion.  Neurological: She  is alert and oriented to person, place, and time.  Skin: Skin is warm and dry.  Psychiatric: She has a normal mood and affect.  Tearful at times when talking about work stress.           Assessment & Plan:

## 2018-10-04 NOTE — Assessment & Plan Note (Signed)
Uncontrolled with recent A1C of 9.5 which is worse than her prior of 7.3. She's only able to tolerate one tablet of Metformin daily due to GI upset. We had a long discussion about the need to work on diet and to start exercising.   We will continue Metformin ER 500 mg once daily.  Rx for Trulicity sent to pharmacy, discussed instructions for administration. Discussed to start monitoring glucose more regularly. Discussed the need for statin therapy, she kindly declines and would like to discuss at next visit.  Managed on ARB.  Follow up in 3 months.

## 2018-10-04 NOTE — Patient Instructions (Addendum)
Call the [facility] to schedule your mammogram.  Continue metformin once daily for diabetes.   Start dulaglutide (Trulicity) 7.09 mg for diabetes. Inject into the skin once weekly.  Continue to monitor your glucose levels, rotate times of checks: Before any meal, 2 hours after any meal, bedtime.  Start exercising. You should be getting 150 minutes of moderate intensity exercise weekly.  It's important to improve your diet by reducing consumption of fast food, fried food, processed snack foods, sugary drinks. Increase consumption of fresh vegetables and fruits, whole grains, water.  Ensure you are drinking 64 ounces of water daily.  Follow up with your gynecologist as discussed.   Please schedule a follow up appointment in 3 months for diabetes check.  It was a pleasure to see you today!   Preventive Care 40-64 Years, Female Preventive care refers to lifestyle choices and visits with your health care provider that can promote health and wellness. What does preventive care include?   A yearly physical exam. This is also called an annual well check.  Dental exams once or twice a year.  Routine eye exams. Ask your health care provider how often you should have your eyes checked.  Personal lifestyle choices, including: ? Daily care of your teeth and gums. ? Regular physical activity. ? Eating a healthy diet. ? Avoiding tobacco and drug use. ? Limiting alcohol use. ? Practicing safe sex. ? Taking low-dose aspirin daily starting at age 65. ? Taking vitamin and mineral supplements as recommended by your health care provider. What happens during an annual well check? The services and screenings done by your health care provider during your annual well check will depend on your age, overall health, lifestyle risk factors, and family history of disease. Counseling Your health care provider may ask you questions about your:  Alcohol use.  Tobacco use.  Drug  use.  Emotional well-being.  Home and relationship well-being.  Sexual activity.  Eating habits.  Work and work Statistician.  Method of birth control.  Menstrual cycle.  Pregnancy history. Screening You may have the following tests or measurements:  Height, weight, and BMI.  Blood pressure.  Lipid and cholesterol levels. These may be checked every 5 years, or more frequently if you are over 70 years old.  Skin check.  Lung cancer screening. You may have this screening every year starting at age 65 if you have a 30-pack-year history of smoking and currently smoke or have quit within the past 15 years.  Colorectal cancer screening. All adults should have this screening starting at age 89 and continuing until age 26. Your health care provider may recommend screening at age 49. You will have tests every 1-10 years, depending on your results and the type of screening test. People at increased risk should start screening at an earlier age. Screening tests may include: ? Guaiac-based fecal occult blood testing. ? Fecal immunochemical test (FIT). ? Stool DNA test. ? Virtual colonoscopy. ? Sigmoidoscopy. During this test, a flexible tube with a tiny camera (sigmoidoscope) is used to examine your rectum and lower colon. The sigmoidoscope is inserted through your anus into your rectum and lower colon. ? Colonoscopy. During this test, a long, thin, flexible tube with a tiny camera (colonoscope) is used to examine your entire colon and rectum.  Hepatitis C blood test.  Hepatitis B blood test.  Sexually transmitted disease (STD) testing.  Diabetes screening. This is done by checking your blood sugar (glucose) after you have not eaten for  a while (fasting). You may have this done every 1-3 years.  Mammogram. This may be done every 1-2 years. Talk to your health care provider about when you should start having regular mammograms. This may depend on whether you have a family history of  breast cancer.  BRCA-related cancer screening. This may be done if you have a family history of breast, ovarian, tubal, or peritoneal cancers.  Pelvic exam and Pap test. This may be done every 3 years starting at age 71. Starting at age 86, this may be done every 5 years if you have a Pap test in combination with an HPV test.  Bone density scan. This is done to screen for osteoporosis. You may have this scan if you are at high risk for osteoporosis. Discuss your test results, treatment options, and if necessary, the need for more tests with your health care provider. Vaccines Your health care provider may recommend certain vaccines, such as:  Influenza vaccine. This is recommended every year.  Tetanus, diphtheria, and acellular pertussis (Tdap, Td) vaccine. You may need a Td booster every 10 years.  Varicella vaccine. You may need this if you have not been vaccinated.  Zoster vaccine. You may need this after age 64.  Measles, mumps, and rubella (MMR) vaccine. You may need at least one dose of MMR if you were born in 1957 or later. You may also need a second dose.  Pneumococcal 13-valent conjugate (PCV13) vaccine. You may need this if you have certain conditions and were not previously vaccinated.  Pneumococcal polysaccharide (PPSV23) vaccine. You may need one or two doses if you smoke cigarettes or if you have certain conditions.  Meningococcal vaccine. You may need this if you have certain conditions.  Hepatitis A vaccine. You may need this if you have certain conditions or if you travel or work in places where you may be exposed to hepatitis A.  Hepatitis B vaccine. You may need this if you have certain conditions or if you travel or work in places where you may be exposed to hepatitis B.  Haemophilus influenzae type b (Hib) vaccine. You may need this if you have certain conditions. Talk to your health care provider about which screenings and vaccines you need and how often you need  them. This information is not intended to replace advice given to you by your health care provider. Make sure you discuss any questions you have with your health care provider. Document Released: 09/24/2015 Document Revised: 10/18/2017 Document Reviewed: 06/29/2015 Elsevier Interactive Patient Education  2019 [employer].

## 2018-10-04 NOTE — Assessment & Plan Note (Signed)
Stable in the office today, continue current regimen.  BMP reviewed. 

## 2019-01-01 ENCOUNTER — Other Ambulatory Visit: Payer: Self-pay | Admitting: Primary Care

## 2019-01-01 DIAGNOSIS — J302 Other seasonal allergic rhinitis: Secondary | ICD-10-CM

## 2019-01-03 ENCOUNTER — Ambulatory Visit: Payer: BLUE CROSS/BLUE SHIELD | Admitting: Primary Care

## 2019-02-18 ENCOUNTER — Telehealth: Payer: Self-pay | Admitting: Obstetrics and Gynecology

## 2019-02-18 NOTE — Telephone Encounter (Signed)
Patient is schedule for mirena replacement / Annual on 02/10/08 with Elmo Putt Copland

## 2019-02-20 NOTE — Telephone Encounter (Signed)
Noted. Will order to arrive by apt date/time. 

## 2019-03-03 ENCOUNTER — Ambulatory Visit: Payer: BC Managed Care – PPO | Admitting: Primary Care

## 2019-03-03 ENCOUNTER — Other Ambulatory Visit: Payer: Self-pay

## 2019-03-03 ENCOUNTER — Encounter: Payer: Self-pay | Admitting: Primary Care

## 2019-03-03 ENCOUNTER — Other Ambulatory Visit: Payer: Self-pay | Admitting: Primary Care

## 2019-03-03 VITALS — BP 120/78 | HR 68 | Temp 98.5°F | Ht 62.0 in | Wt 190.2 lb

## 2019-03-03 DIAGNOSIS — E119 Type 2 diabetes mellitus without complications: Secondary | ICD-10-CM

## 2019-03-03 DIAGNOSIS — I10 Essential (primary) hypertension: Secondary | ICD-10-CM | POA: Diagnosis not present

## 2019-03-03 LAB — POCT GLYCOSYLATED HEMOGLOBIN (HGB A1C): Hemoglobin A1C: 6.3 % — AB (ref 4.0–5.6)

## 2019-03-03 MED ORDER — METFORMIN HCL 500 MG PO TABS: 500.0000 mg | ORAL_TABLET | Freq: Two times a day (BID) | ORAL | 3 refills | Status: DC

## 2019-03-03 NOTE — Progress Notes (Signed)
Subjective:    Patient ID: Sofie HartiganKristie S Fjeld, female    DOB: 12-Apr-1976, 43 y.o.   MRN: [id number]  HPI  Ms. Karstens is a 43 year old female who presents today for follow up of type 2 diabetes.  Current medications include: Metformin ER 500 mg daily, Trulicity 0.75 mg weekly.  She is checking her blood glucose 1 times daily and is getting readings of:  Am fasting: 130-140's  Last A1C: 9.5 in January 2020, 6.3 today Last Eye Exam: Completes annually Last Foot Exam: Due today Pneumonia Vaccination: Never completed ACE/ARB: Olmesartan  Statin: None  Diet currently consists of:  Breakfast: Peanut butter with crackers, peanut butter sandiwch Lunch: Sandwich, fruit Dinner: Meat, vegetable, starch Snacks: Nuts Desserts: Twice weekly  Beverages: Little sweet tea, mostly water with flavor. No juice and soda  Exercise: She is not exercising. Some walking.  BP Readings from Last 3 Encounters:  03/03/19 120/78  10/04/18 108/72  08/23/18 106/70   Wt Readings from Last 3 Encounters:  03/03/19 190 lb 4 oz (86.3 kg)  10/04/18 188 lb 8 oz (85.5 kg)  08/23/18 192 lb (87.1 kg)     Review of Systems  Constitutional: Negative for unexpected weight change.  Eyes: Negative for visual disturbance.  Respiratory: Negative for shortness of breath.   Cardiovascular: Negative for chest pain.  Neurological: Negative for dizziness and headaches.       Past Medical History:  Diagnosis Date  . Abscess of abdominal cavity (HCC)   . Diabetes mellitus without complication (HCC)    BORDERLINE  . Diverticulitis   . Diverticulitis large intestine 01/05/2016  . Diverticulitis of large intestine without perforation or abscess without bleeding   . GERD (gastroesophageal reflux disease)   . Hypertension    CONTROLLED ON MEDS  . Pneumonia FEB 26,2017   DR SAYS CLEAR  . Sleep apnea    PATIENT [facility]IN[facility] MAY HAVE/ [facility]     Social History   Socioeconomic History  .  Marital status: Married    Spouse name: Not on file  . Number of children: Not on file  . Years of education: Not on file  . Highest education level: Not on file  Occupational History  . Not on file  Social Needs  . Financial resource strain: Not on file  . Food insecurity    Worry: Not on file    Inability: Not on file  . Transportation needs    Medical: Not on file    Non-medical: Not on file  Tobacco Use  . Smoking status: Never Smoker  . Smokeless tobacco: Never Used  Substance and Sexual Activity  . Alcohol use: No  . Drug use: No  . Sexual activity: Not on file  Lifestyle  . Physical activity    Days per week: Not on file    Minutes per session: Not on file  . Stress: Not on file  Relationships  . Social Musicianconnections    Talks on phone: Not on file    Gets together: Not on file    Attends religious service: Not on file    Active member of club or organization: Not on file    Attends meetings of clubs or organizations: Not on file    Relationship status: Not on file  . Intimate partner violence    Fear of current or ex partner: Not on file    Emotionally abused: Not on file    Physically abused: Not  on file    Forced sexual activity: Not on file  Other Topics Concern  . Not on file  Social History Narrative   Married.   2 children.    Works as a Software engineer.   Enjoys camping, being out doors, Print production planner.     Past Surgical History:  Procedure Laterality Date  . CESAREAN SECTION  2007 and 2011  . CHOLECYSTECTOMY  2001   Dr. Pat Patrick  . COLON RESECTION N/A 01/05/2016   Procedure: HAND ASSISTED LAPAROSCOPIC COLON RESECTION;  Surgeon: Jules Husbands, MD;  Location: [facility];  Service: General;  Laterality: N/A;  . COLONOSCOPY WITH PROPOFOL N/A 12/16/2015   Procedure: COLONOSCOPY WITH PROPOFOL;  Surgeon: Lucilla Lame, MD;  Location: [place];  Service: Endoscopy;  Laterality: N/A;  DIABETIC/ ORAL MED/ IUD  . TRANS GLUTEAL DRAIN PLACEMENT     SEDATION     Family History  Problem Relation Age of Onset  . Hyperlipidemia Mother   . Sleep apnea Mother   . Hypothyroidism Sister   . Diabetes Maternal Grandmother   . Breast cancer Neg Hx     No Known Allergies  Current Outpatient Medications on File Prior to Visit  Medication Sig Dispense Refill  . Dulaglutide (TRULICITY) 9.38 BO/1.7PZ SOPN Inject into the skin once weekly for diabetes. 2 mL 5  . olmesartan-hydrochlorothiazide (BENICAR HCT) 20-12.5 MG tablet Take 1 tablet by mouth daily. For blood pressure. 90 tablet 3  . olopatadine (PATANOL) 0.1 % ophthalmic solution INSTILL 1 DROP INTO EACH EYE TWICE DAILY 5 mL 1   No current facility-administered medications on file prior to visit.     BP 120/78   Pulse 68   Temp 98.5 F (36.9 C) (Oral)   Ht 5\' 2"  (1.575 m)   Wt 190 lb 4 oz (86.3 kg)   SpO2 98%   BMI 34.80 kg/m    Objective:   Physical Exam  Constitutional: She appears well-nourished.  Neck: Neck supple.  Cardiovascular: Normal rate and regular rhythm.  Respiratory: Effort normal and breath sounds normal.  Skin: Skin is warm and dry.  Psychiatric: She has a normal mood and affect.           Assessment & Plan:

## 2019-03-03 NOTE — Patient Instructions (Signed)
We've switched you from Metformin ER 500 mg once daily to Metformin 500 mg twice daily.    Continue Trulicity weekly.  Complete your pneumonia vaccination as discussed.  Continue to work hard on your diet, start regular exercise. You should be getting 150 minutes of moderate intensity exercise weekly.  Please schedule a physical with me in 6 months. You may also schedule a lab only appointment 3-4 days prior. We will discuss your lab results in detail during your physical.  It was a pleasure to see you today!

## 2019-03-03 NOTE — Assessment & Plan Note (Signed)
Repeat A1C today of 6.3 which is a significant improvement from 9.5 in January 2020.  Continue Metformin 500 mg, had to change to regular as ER version has been recalled. Continue Trulicity.  Foot exam today. She will complete pneumonia vaccination at work. Managed on ARB. Repeat lipids during next visit.  Follow up in 6-7 months.

## 2019-03-03 NOTE — Assessment & Plan Note (Signed)
Stable in the office today, continue current regimen. 

## 2019-03-05 ENCOUNTER — Ambulatory Visit
Admission: RE | Admit: 2019-03-05 | Discharge: 2019-03-05 | Disposition: A | Discharge status: Home / Self Care | Payer: BC Managed Care – PPO | Source: Ambulatory Visit | Attending: Primary Care | Admitting: Primary Care

## 2019-03-05 ENCOUNTER — Other Ambulatory Visit: Payer: Self-pay

## 2019-03-05 DIAGNOSIS — Z1239 Encounter for other screening for malignant neoplasm of breast: Secondary | ICD-10-CM

## 2019-03-05 DIAGNOSIS — Z1231 Encounter for screening mammogram for malignant neoplasm of breast: Secondary | ICD-10-CM | POA: Diagnosis not present

## 2019-03-25 NOTE — Telephone Encounter (Signed)
Patient reschedule appointment to 05/05/19 with [facility]

## 2019-03-26 ENCOUNTER — Other Ambulatory Visit: Payer: Self-pay | Admitting: Primary Care

## 2019-03-26 DIAGNOSIS — J302 Other seasonal allergic rhinitis: Secondary | ICD-10-CM

## 2019-04-01 ENCOUNTER — Ambulatory Visit: Payer: Self-pay | Admitting: Obstetrics and Gynecology

## 2019-05-05 ENCOUNTER — Other Ambulatory Visit (HOSPITAL_COMMUNITY)
Admission: RE | Admit: 2019-05-05 | Discharge: 2019-05-05 | Disposition: A | Discharge status: Home / Self Care | Payer: BC Managed Care – PPO | Source: Ambulatory Visit | Attending: Obstetrics and Gynecology | Admitting: Obstetrics and Gynecology

## 2019-05-05 ENCOUNTER — Encounter: Payer: Self-pay | Admitting: Obstetrics and Gynecology

## 2019-05-05 ENCOUNTER — Ambulatory Visit (INDEPENDENT_AMBULATORY_CARE_PROVIDER_SITE_OTHER): Payer: BC Managed Care – PPO | Admitting: Obstetrics and Gynecology

## 2019-05-05 ENCOUNTER — Other Ambulatory Visit: Payer: Self-pay

## 2019-05-05 VITALS — BP 120/80 | Ht 62.0 in | Wt 190.0 lb

## 2019-05-05 DIAGNOSIS — Z1151 Encounter for screening for human papillomavirus (HPV): Secondary | ICD-10-CM | POA: Insufficient documentation

## 2019-05-05 DIAGNOSIS — Z124 Encounter for screening for malignant neoplasm of cervix: Secondary | ICD-10-CM | POA: Diagnosis not present

## 2019-05-05 DIAGNOSIS — Z3202 Encounter for pregnancy test, result negative: Secondary | ICD-10-CM

## 2019-05-05 DIAGNOSIS — Z30433 Encounter for removal and reinsertion of intrauterine contraceptive device: Secondary | ICD-10-CM

## 2019-05-05 DIAGNOSIS — Z01419 Encounter for gynecological examination (general) (routine) without abnormal findings: Secondary | ICD-10-CM

## 2019-05-05 LAB — POCT URINE PREGNANCY: Preg Test, Ur: NEGATIVE

## 2019-05-05 MED ORDER — LEVONORGESTREL 20 MCG/24HR IU IUD: 1.0000 | INTRAUTERINE_SYSTEM | Freq: Once | INTRAUTERINE | 0 refills | Status: AC

## 2019-05-05 NOTE — Addendum Note (Signed)
Addended by: Ardeth Perfect B on: 05/07/785 01:43 PM   Modules accepted: Orders

## 2019-05-05 NOTE — Progress Notes (Signed)
PCP:  Doreene Nestlark, Katherine K, NP   Chief Complaint  Patient presents with   Gynecologic Exam   Contraception    Mirena removal/reinsertion     HPI:      Ms. Maria HartiganKristie S Touchet is a 43 y.o. [phone] who LMP was No LMP recorded. (Menstrual status: IUD)., presents today for her NP> yrs annual examination.  Her menses are absent with IUD. Dysmenorrhea none. She does not have intermenstrual bleeding.  Sex activity: single partner, contraception - IUD. Mirena placed 09/2013; due for replacement. Last Pap: December 17, 2014  Results were: no abnormalities /neg HPV DNA  Hx of STDs: none  Last mammogram: March 05, 2019  Results were: normal--routine follow-up in 12 months There is a FH of breast cancer in her cousin, genetic testing not done but not indicated for pt. There is no FH of ovarian cancer. The patient does do self-breast exams.  Tobacco use: The patient denies current or previous tobacco use. Alcohol use: none No drug use.  Exercise: not active  She does get adequate calcium and Vitamin D in her diet. Labs with PCP.   Past Medical History:  Diagnosis Date   Abscess of abdominal cavity (HCC)    Diabetes mellitus without complication (HCC)    BORDERLINE   Diverticulitis    Diverticulitis large intestine 01/05/2016   Diverticulitis of large intestine without perforation or abscess without bleeding    GERD (gastroesophageal reflux disease)    Hypertension    CONTROLLED ON MEDS   Pneumonia FEB 26,2017   DR SAYS CLEAR   Sleep apnea    PATIENT THINKS MAY HAVE/ HAS APPT WITH PULMONARY DR AT [facility]    Past Surgical History:  Procedure Laterality Date   CESAREAN SECTION  2007 and 2011   CHOLECYSTECTOMY  2001   Dr. Michela PitcherEly   COLON RESECTION N/A 01/05/2016   Procedure: HAND ASSISTED LAPAROSCOPIC COLON RESECTION;  Surgeon: Leafy Roiego F Pabon, MD;  Location: [facility] ORS;  Service: General;  Laterality: N/A;   COLONOSCOPY WITH PROPOFOL N/A 12/16/2015   Procedure: COLONOSCOPY WITH PROPOFOL;   Surgeon: Midge Miniumarren Wohl, MD;  [facility];  Service: Endoscopy;  Laterality: N/A;  DIABETIC/ ORAL MED/ IUD   TRANS GLUTEAL DRAIN PLACEMENT     SEDATION    Family History  Problem Relation Age of Onset   Hyperlipidemia Mother    Sleep apnea Mother    Hypertension Mother    Hypothyroidism Sister    Diabetes Maternal Grandmother    Other Maternal Grandmother        cardiovascular disease   Hypertension Maternal Grandmother    Hypertension Paternal Grandmother    Stroke Paternal Grandmother    Colon cancer Paternal Grandfather    Breast cancer Cousin [phone]    Social History   Socioeconomic History   Marital status: Married    Spouse name: Not on file   Number of children: Not on file   Years of education: Not on file   Highest education level: Not on file  Occupational History   Not on file  Social Needs   Financial resource strain: Not on file   Food insecurity    Worry: Not on file    Inability: Not on file   Transportation needs    Medical: Not on file    Non-medical: Not on file  Tobacco Use   Smoking status: Never Smoker   Smokeless tobacco: Never Used  Substance and Sexual Activity   Alcohol use: No  Drug use: No   Sexual activity: Yes    Birth control/protection: I.U.D.    Comment: Mirena  Lifestyle   Physical activity    Days per week: Not on file    Minutes per session: Not on file   Stress: Not on file  Relationships   Social connections    Talks on phone: Not on file    Gets together: Not on file    Attends religious service: Not on file    Active member of club or organization: Not on file    Attends meetings of clubs or organizations: Not on file    Relationship status: Not on file   Intimate partner violence    Fear of current or ex partner: Not on file    Emotionally abused: Not on file    Physically abused: Not on file    Forced sexual activity: Not on file  Other Topics Concern   Not on file    Social History Narrative   Married.   2 children.    Works as a Teacher, early years/prepharmacist.   Enjoys camping, being out doors, Dance movement psychotherapistwater skiiing.     Outpatient Medications Prior to Visit  Medication Sig Dispense Refill   metFORMIN (GLUCOPHAGE-XR) 500 MG 24 hr tablet TAKE 1 TABLET BY MOUTH ONCE DAILY WITH BREAKFAST FOR DIABETES     olmesartan-hydrochlorothiazide (BENICAR HCT) 20-12.5 MG tablet Take 1 tablet by mouth daily. For blood pressure. 90 tablet 3   olopatadine (PATANOL) 0.1 % ophthalmic solution INSTILL 1 DROP INTO EACH EYE TWICE DAILY 5 mL 2   TRULICITY 0.75 MG/0.5ML SOPN INJECT 1 SYRINGE SUBCUTANEOUSLY ONCE A WEEK FOR DIABETES 4 mL 2   levonorgestrel (MIRENA) 20 MCG/24HR IUD 1 each by Intrauterine route once.     metFORMIN (GLUCOPHAGE) 500 MG tablet Take 1 tablet (500 mg total) by mouth 2 (two) times daily with a meal. 180 tablet 3   No facility-administered medications prior to visit.       ROS:  Review of Systems  Constitutional: Negative for fatigue, fever and unexpected weight change.  Respiratory: Negative for cough, shortness of breath and wheezing.   Cardiovascular: Negative for chest pain, palpitations and leg swelling.  Gastrointestinal: Negative for blood in stool, constipation, diarrhea, nausea and vomiting.  Endocrine: Negative for cold intolerance, heat intolerance and polyuria.  Genitourinary: Negative for dyspareunia, dysuria, flank pain, frequency, genital sores, hematuria, menstrual problem, pelvic pain, urgency, vaginal bleeding, vaginal discharge and vaginal pain.  Musculoskeletal: Negative for back pain, joint swelling and myalgias.  Skin: Negative for rash.  Neurological: Negative for dizziness, syncope, light-headedness, numbness and headaches.  Hematological: Negative for adenopathy.  Psychiatric/Behavioral: Negative for agitation, confusion, sleep disturbance and suicidal ideas. The patient is not nervous/anxious.   BREAST: No symptoms   Objective: BP  120/80    Ht 5\' 2"  (1.575 m)    Wt 190 lb (86.2 kg)    BMI 34.75 kg/m    Physical Exam Constitutional:      Appearance: She is well-developed.  Genitourinary:     Vulva, vagina, cervix, uterus, right adnexa and left adnexa normal.     No vulval lesion or tenderness noted.     No vaginal discharge, erythema or tenderness.     No cervical polyp.     IUD strings visualized.     Uterus is not enlarged or tender.     No right or left adnexal mass present.     Right adnexa not tender.     Left  adnexa not tender.  Neck:     Musculoskeletal: Normal range of motion.     Thyroid: No thyromegaly.  Cardiovascular:     Rate and Rhythm: Normal rate and regular rhythm.     Heart sounds: Normal heart sounds. No murmur.  Pulmonary:     Effort: Pulmonary effort is normal.     Breath sounds: Normal breath sounds.  Chest:     Breasts:        Right: No mass, nipple discharge, skin change or tenderness.        Left: No mass, nipple discharge, skin change or tenderness.  Abdominal:     Palpations: Abdomen is soft.     Tenderness: There is no abdominal tenderness. There is no guarding.  Musculoskeletal: Normal range of motion.  Neurological:     General: No focal deficit present.     Mental Status: She is alert and oriented to person, place, and time.     Cranial Nerves: No cranial nerve deficit.  Skin:    General: Skin is warm and dry.  Psychiatric:        Mood and Affect: Mood normal.        Behavior: Behavior normal.        Thought Content: Thought content normal.        Judgment: Judgment normal.  Vitals signs reviewed.     IUD Removal Strings of IUD identified and grasped.  IUD removed without problem with ring forceps.  Pt tolerated this well.  IUD noted to be intact.  IUD Insertion Procedure Note Patient identified, informed consent performed, consent signed.   Discussed risks of irregular bleeding, cramping, infection, malpositioning or misplacement of the IUD outside the uterus  which may require further procedure such as laparoscopy, risk of failure <1%. Time out was performed.  Urine pregnancy test negative.  Speculum placed in the vagina.  Cervix visualized.  Cleaned with Betadine x 2.  Grasped anteriorly with a single tooth tenaculum.  Uterus sounded to 10.0 cm.   IUD placed per manufacturer's recommendations.  Strings trimmed to 3 cm. Tenaculum was removed, good hemostasis noted.  Patient tolerated procedure well.    Results: Results for orders placed or performed in visit on 05/05/19 (from the past 24 hour(s))  POCT urine pregnancy     Status: Normal   Collection Time: 05/05/19 10:41 AM  Result Value Ref Range   Preg Test, Ur Negative Negative    Assessment/Plan: Encounter for annual routine gynecological examination  Cervical cancer screening  Screening for HPV (human papillomavirus)  Encounter for removal and reinsertion of intrauterine contraceptive device (IUD) - Plan: POCT urine pregnancy, levonorgestrel (MIRENA) 20 MCG/24HR IUD  Meds ordered this encounter  Medications   levonorgestrel (MIRENA) 20 MCG/24HR IUD    Sig: 1 Intra Uterine Device (1 each total) by Intrauterine route once for 1 dose.    Dispense:  1 each    Refill:  0    Order Specific Question:   Supervising Provider    Answer:   Nadara MustardHARRIS, ROBERT P [[id number]]             GYN counsel mammography screening, adequate intake of calcium and vitamin D, diet and exercise  Patient was given post-procedure instructions.  She was advised to have backup contraception for one week.   Call if you are having increasing pain, cramps or bleeding or if you have a fever greater than 100.4 degrees F., shaking chills, nausea or vomiting. Patient was also asked to check  IUD strings periodically and follow up in 4 weeks for IUD check.     F/U    Return in about 4 weeks (around 06/02/2019) for IUD f/u.  Antionio Negron B. Christo Hain, PA-C 05/05/2019 10:44 AM

## 2019-05-05 NOTE — Patient Instructions (Signed)
I value your feedback and entrusting us with your care. If you get a [facility] patient survey, I would appreciate you taking the time to let us know about your experience today. Thank you! 

## 2019-05-07 LAB — CYTOLOGY - PAP
Adequacy: ABSENT
Diagnosis: NEGATIVE
HPV: NOT DETECTED

## 2019-06-01 NOTE — Progress Notes (Signed)
   Chief Complaint  Patient presents with  . Follow-up    IUD check     History of Present Illness:  Maria Mcbride is a 43 y.o. that had a Mirena IUD REplaced approximately 1 month ago. Since that time, she denies dyspareunia, pelvic pain, non-menstrual bleeding, vaginal d/c, heavy bleeding.   Review of Systems  Constitutional: Negative for fever.  Gastrointestinal: Negative for blood in stool, constipation, diarrhea, nausea and vomiting.  Genitourinary: Negative for dyspareunia, dysuria, flank pain, frequency, hematuria, urgency, vaginal bleeding, vaginal discharge and vaginal pain.  Musculoskeletal: Negative for back pain.  Skin: Negative for rash.    Physical Exam:  BP 110/70   Ht 5\' 2"  (1.575 m)   Wt 192 lb (87.1 kg)   LMP 05/05/2019 (Exact Date)   BMI 35.12 kg/m  Body mass index is 35.12 kg/m.  Pelvic exam:  Two IUD strings present seen coming from the cervical os. EGBUS, vaginal vault and cervix: within normal limits   Assessment:   Encounter for routine checking of intrauterine contraceptive device (IUD)  IUD strings present in proper location; pt doing well  Plan: F/u if any signs of infection or can no longer feel the strings.   Truc Winfree B. Inita Uram, PA-C 06/02/2019 10:21 AM

## 2019-06-02 ENCOUNTER — Ambulatory Visit (INDEPENDENT_AMBULATORY_CARE_PROVIDER_SITE_OTHER): Payer: BC Managed Care – PPO | Admitting: Obstetrics and Gynecology

## 2019-06-02 ENCOUNTER — Other Ambulatory Visit: Payer: Self-pay

## 2019-06-02 ENCOUNTER — Encounter: Payer: Self-pay | Admitting: Obstetrics and Gynecology

## 2019-06-02 VITALS — BP 110/70 | Ht 62.0 in | Wt 192.0 lb

## 2019-06-02 DIAGNOSIS — Z30431 Encounter for routine checking of intrauterine contraceptive device: Secondary | ICD-10-CM

## 2019-06-02 NOTE — Patient Instructions (Signed)
I value your feedback and entrusting us with your care. If you get a [place] [facility] patient survey, I would appreciate you taking the time to let us know about your experience today. Thank you! 

## 2019-07-28 ENCOUNTER — Other Ambulatory Visit: Payer: Self-pay | Admitting: Primary Care

## 2019-07-28 DIAGNOSIS — I10 Essential (primary) hypertension: Secondary | ICD-10-CM

## 2019-09-01 ENCOUNTER — Encounter: Payer: Self-pay | Admitting: Acute Care

## 2019-09-01 ENCOUNTER — Ambulatory Visit (INDEPENDENT_AMBULATORY_CARE_PROVIDER_SITE_OTHER): Payer: BC Managed Care – PPO | Admitting: Acute Care

## 2019-09-01 DIAGNOSIS — Z9989 Dependence on other enabling machines and devices: Secondary | ICD-10-CM

## 2019-09-01 DIAGNOSIS — G4733 Obstructive sleep apnea (adult) (pediatric): Secondary | ICD-10-CM | POA: Diagnosis not present

## 2019-09-01 NOTE — Patient Instructions (Signed)
It is nice to talk with you today. You have excellent control with your CPAP machine You are having 0.5 events per hour.  Continue on CPAP at bedtime. You appear to be benefiting from the treatment  Goal is to wear for at least 6 hours each night for maximal clinical benefit. Continue to work on weight loss, as the link between excess weight  and sleep apnea is well established.   Remember to establish a good bedtime routine, and work on sleep hygiene.  Limit daytime naps , avoid stimulants such as caffeine and nicotine close to bedtime, exercise daily to promote sleep quality, avoid heavy , spicy, fried , or rich foods before bed. Ensure adequate exposure to natural light during the day,establish a relaxing bedtime routine with a pleasant sleep environment ( Bedroom between 60 and 67 degrees, turn off bright lights , TV or device screens screens , consider black out curtains or white noise machines) Do not drive if sleepy. Remember to clean mask, tubing, filter, and reservoir once weekly with soapy water/ or use So Clean Machine daily  Follow up with Dr. Mortimer Fries   In 12 months  or before as needed.  Please contact office for sooner follow up if symptoms do not improve or worsen or seek emergency care

## 2019-09-01 NOTE — Progress Notes (Signed)
Virtual Visit via Telephone Note  I connected with Maria Mcbride on 09/01/19 at  4:00 PM EST by telephone and verified that I am speaking with the correct person using two identifiers.  Location: Patient: At home Provider: At the office at [address] , [place], [place] , [place] [address]   I discussed the limitations, risks, security and privacy concerns of performing an evaluation and management service by telephone and the availability of in person appointments. I also discussed with the patient that there may be a patient responsible charge related to this service. The patient expressed understanding and agreed to proceed.   History of Present Illness: Pt. Presents for follow up of OSA with CPAP therapy. She states she has been doing well. She has excellent compliance She uses her device every night. She sleeps well all night .No daytime sleepiness  No am headaches . She has all the supplies she needs Average use is 7  Hours per night.    Observations/Objective: Down Load 07/29/2019-08/27/2019 Usage 30/30 days ( 100%) > 4 hours 100% of the time Average 7 hours 25 minutes AirSense 10 CPAP Set pressure 10 cm H2O AHI 0.5  Assessment and Plan: OSA on CPAP Well controlled on set pressure of 10 cm H2O AHI is 0.5 Plan Continue on CPAP at bedtime. You appear to be benefiting from the treatment  Goal is to wear for at least 6 hours each night for maximal clinical benefit. Continue to work on weight loss, as the link between excess weight  and sleep apnea is well established.   Remember to establish a good bedtime routine, and work on sleep hygiene.  Limit daytime naps , avoid stimulants such as caffeine and nicotine close to bedtime, exercise daily to promote sleep quality, avoid heavy , spicy, fried , or rich foods before bed. Ensure adequate exposure to natural light during the day,establish a relaxing bedtime routine with a pleasant sleep environment ( Bedroom between 60 and 67  degrees, turn off bright lights , TV or device screens screens , consider black out curtains or white noise machines) Do not drive if sleepy. Remember to clean mask, tubing, filter, and reservoir once weekly with soapy water/ or use So Clean Machine daily  Follow up with Dr. Mortimer Fries   In 12 months  or before as needed.  Please contact office for sooner follow up if symptoms do not improve or worsen or seek emergency care     Follow Up Instructions: Follow up in 12 months with Dr. Mortimer Fries   I discussed the assessment and treatment plan with the patient. The patient was provided an opportunity to ask questions and all were answered. The patient agreed with the plan and demonstrated an understanding of the instructions.   The patient was advised to call back or seek an in-person evaluation if the symptoms worsen or if the condition fails to improve as anticipated.  I provided 33 minutes of non-face-to-face time during this encounter.   Maria Spatz, NP  09/01/2019 4:19 PM

## 2019-09-12 ENCOUNTER — Other Ambulatory Visit: Payer: Self-pay | Admitting: Primary Care

## 2019-09-12 DIAGNOSIS — I10 Essential (primary) hypertension: Secondary | ICD-10-CM

## 2019-09-12 DIAGNOSIS — E119 Type 2 diabetes mellitus without complications: Secondary | ICD-10-CM

## 2019-09-25 ENCOUNTER — Other Ambulatory Visit: Payer: Self-pay | Admitting: Primary Care

## 2019-09-25 DIAGNOSIS — R7303 Prediabetes: Secondary | ICD-10-CM

## 2019-09-25 DIAGNOSIS — I10 Essential (primary) hypertension: Secondary | ICD-10-CM

## 2019-09-25 DIAGNOSIS — E785 Hyperlipidemia, unspecified: Secondary | ICD-10-CM

## 2019-09-25 DIAGNOSIS — E119 Type 2 diabetes mellitus without complications: Secondary | ICD-10-CM

## 2019-10-03 ENCOUNTER — Other Ambulatory Visit: Payer: BC Managed Care – PPO

## 2019-10-08 ENCOUNTER — Encounter: Payer: BC Managed Care – PPO | Admitting: Primary Care

## 2019-10-31 ENCOUNTER — Telehealth: Payer: Self-pay | Admitting: Primary Care

## 2019-10-31 DIAGNOSIS — E119 Type 2 diabetes mellitus without complications: Secondary | ICD-10-CM

## 2019-10-31 NOTE — Telephone Encounter (Signed)
This patient is overdue for diabetes follow up and needs to be scheduled. She cancelled her most recent appointment, please have her reschedule.

## 2019-10-31 NOTE — Telephone Encounter (Signed)
Last prescribed on 09/15/2019. Last appointment on 03/03/2019.  Patient did had an appointment on 10/08/2019 but she canceled it. No future appointment.

## 2019-11-07 NOTE — Telephone Encounter (Signed)
lvm asking pt to call office °

## 2019-11-12 NOTE — Telephone Encounter (Signed)
Patient called back and scheduled diabetes f/u on 11/17/19.

## 2019-11-12 NOTE — Telephone Encounter (Signed)
Noted. Patient have enough refill until schedule appointment.

## 2019-11-17 ENCOUNTER — Ambulatory Visit (INDEPENDENT_AMBULATORY_CARE_PROVIDER_SITE_OTHER): Payer: BC Managed Care – PPO | Admitting: Primary Care

## 2019-11-17 ENCOUNTER — Encounter: Payer: Self-pay | Admitting: Primary Care

## 2019-11-17 ENCOUNTER — Other Ambulatory Visit: Payer: Self-pay

## 2019-11-17 DIAGNOSIS — I10 Essential (primary) hypertension: Secondary | ICD-10-CM | POA: Diagnosis not present

## 2019-11-17 DIAGNOSIS — E119 Type 2 diabetes mellitus without complications: Secondary | ICD-10-CM

## 2019-11-17 LAB — COMPREHENSIVE METABOLIC PANEL
ALT: 28 U/L (ref 0–35)
AST: 19 U/L (ref 0–37)
Albumin: 4.5 g/dL (ref 3.5–5.2)
Alkaline Phosphatase: 67 U/L (ref 39–117)
BUN: 10 mg/dL (ref 6–23)
CO2: 31 mEq/L (ref 19–32)
Calcium: 9.4 mg/dL (ref 8.4–10.5)
Chloride: 98 mEq/L (ref 96–112)
Creatinine, Ser: 0.79 mg/dL (ref 0.40–1.20)
GFR: 78.81 mL/min (ref >60.00)
Glucose, Bld: 151 mg/dL — ABNORMAL HIGH (ref 70–99)
Potassium: 3.7 mEq/L (ref 3.5–5.1)
Sodium: 136 mEq/L (ref 135–145)
Total Bilirubin: 0.7 mg/dL (ref 0.2–1.2)
Total Protein: 7.3 g/dL (ref 6.0–8.3)

## 2019-11-17 LAB — POCT GLYCOSYLATED HEMOGLOBIN (HGB A1C): Hemoglobin A1C: 7.8 % — AB (ref 4.0–5.6)

## 2019-11-17 LAB — LIPID PANEL
Cholesterol: 167 mg/dL (ref 0–200)
HDL: 46.1 mg/dL (ref >39.00)
LDL Cholesterol: 81 mg/dL (ref 0–99)
NonHDL: 120.79
Total CHOL/HDL Ratio: 4
Triglycerides: 197 mg/dL — ABNORMAL HIGH (ref 0.0–149.0)
VLDL: 39.4 mg/dL (ref 0.0–40.0)

## 2019-11-17 MED ORDER — METFORMIN HCL ER 500 MG PO TB24: 500.0000 mg | ORAL_TABLET | Freq: Every day | ORAL | 3 refills | Status: DC

## 2019-11-17 MED ORDER — OLMESARTAN MEDOXOMIL-HCTZ 20-12.5 MG PO TABS: 1.0000 | ORAL_TABLET | Freq: Every day | ORAL | 3 refills | Status: DC

## 2019-11-17 MED ORDER — TRULICITY 0.75 MG/0.5ML ~~LOC~~ SOAJ: 0.5000 mL | SUBCUTANEOUS | 5 refills | Status: DC

## 2019-11-17 NOTE — Patient Instructions (Signed)
Stop by the lab prior to leaving today. I will notify you of your results once received.   Start exercising. You should be getting 150 minutes of moderate intensity exercise weekly.  It is important that you improve your diet. Please limit carbohydrates in the form of white bread, rice, pasta, sweets, fast food, fried food, sugary drinks, etc. Increase your consumption of fresh fruits and vegetables, whole grains, lean protein.  Ensure you are consuming 64 ounces of water daily.  Please schedule a follow up appointment in 6 months for diabetes check.   It was a pleasure to see you today!  

## 2019-11-17 NOTE — Progress Notes (Signed)
Subjective:    Patient ID: Maria Mcbride, female    DOB: 10-May-1976, 44 y.o.   MRN: [id number]  HPI  This visit occurred during the SARS-CoV-2 public health emergency.  Safety protocols were in place, including screening questions prior to the visit, additional usage of staff PPE, and extensive cleaning of exam room while observing appropriate contact time as indicated for disinfecting solutions.   Maria Mcbride is a 44 year old female with a history of hypertension, type 2 diabetes, obesity who presents today for follow up.  1) Type 2 Diabetes:  Current medications include: Metformin XR 500 mg, Trulicity 0.75 mg weekly.  She is checking her blood glucose on occasion. She does admit to a lot of stress during Covid-19 pandemic, diet hasn't been the best. She is not exercising.   AM fasting: 130-140  Last A1C: 6.3 in June 2020, 7.8  Last Eye Exam: Due Last Foot Exam: Due in Summer 2021 Pneumonia Vaccination: Completed in 2020 ACE/ARB: ARB Statin: None. LDL of 95 in 2020  2) Essential Hypertension: Currently managed on olmesartan-HCTZ 20-12.5 mg.  She denies chest pain, dizziness, headaches.   BP Readings from Last 3 Encounters:  11/17/19 120/82  06/02/19 110/70  05/05/19 120/80     Review of Systems  Eyes: Negative for visual disturbance.  Respiratory: Negative for shortness of breath.   Cardiovascular: Negative for chest pain.  Neurological: Negative for dizziness, numbness and headaches.       Past Medical History:  Diagnosis Date  . Abscess of abdominal cavity (HCC)   . Diabetes mellitus without complication (HCC)    BORDERLINE  . Diverticulitis   . Diverticulitis large intestine 01/05/2016  . Diverticulitis of large intestine without perforation or abscess without bleeding   . GERD (gastroesophageal reflux disease)   . Hypertension    CONTROLLED ON MEDS  . Pneumonia FEB 26,2017   DR SAYS CLEAR  . Sleep apnea    PATIENT THINKS MAY HAVE/ HAS APPT WITH PULMONARY  DR AT [facility]     Social History   Socioeconomic History  . Marital status: Married    Spouse name: Not on file  . Number of children: Not on file  . Years of education: Not on file  . Highest education level: Not on file  Occupational History  . Not on file  Tobacco Use  . Smoking status: Never Smoker  . Smokeless tobacco: Never Used  Substance and Sexual Activity  . Alcohol use: No  . Drug use: No  . Sexual activity: Yes    Birth control/protection: I.U.D.    Comment: Mirena  Other Topics Concern  . Not on file  Social History Narrative   Married.   2 children.    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    Social Determinants of Health   Financial Resource Strain:   . Difficulty of Paying Living Expenses: Not on file  Food Insecurity:   . Worried About Programme researcher, broadcasting/film/video in the Last Year: Not on file  . Ran Out of Food in the Last Year: Not on file  Transportation Needs:   . Lack of Transportation (Medical): Not on file  . Lack of Transportation (Non-Medical): Not on file  Physical Activity:   . Days of Exercise per Week: Not on file  . Minutes of Exercise per Session: Not on file  Stress:   . Feeling of Stress : Not on file  Social Connections:   .  Frequency of Communication with Friends and Family: Not on file  . Frequency of Social Gatherings with Friends and Family: Not on file  . Attends Religious Services: Not on file  . Active Member of Clubs or Organizations: Not on file  . Attends Archivist Meetings: Not on file  . Marital Status: Not on file  Intimate Partner Violence:   . Fear of Current or Ex-Partner: Not on file  . Emotionally Abused: Not on file  . Physically Abused: Not on file  . Sexually Abused: Not on file    Past Surgical History:  Procedure Laterality Date  . CESAREAN SECTION  2007 and 2011  . CHOLECYSTECTOMY  2001   Dr. Pat Patrick  . COLON RESECTION N/A 01/05/2016   Procedure: HAND ASSISTED  LAPAROSCOPIC COLON RESECTION;  Surgeon: Jules Husbands, MD;  Location: [facility] ORS;  Service: General;  Laterality: N/A;  . COLONOSCOPY WITH PROPOFOL N/A 12/16/2015   Procedure: COLONOSCOPY WITH PROPOFOL;  Surgeon: Lucilla Lame, MD;  Location: [facility];  Service: Endoscopy;  Laterality: N/A;  DIABETIC/ ORAL MED/ IUD  . TRANS GLUTEAL DRAIN PLACEMENT     SEDATION    Family History  Problem Relation Age of Onset  . Hyperlipidemia Mother   . Sleep apnea Mother   . Hypertension Mother   . Hypothyroidism Sister   . Diabetes Maternal Grandmother   . Other Maternal Grandmother        cardiovascular disease  . Hypertension Maternal Grandmother   . Hypertension Paternal Grandmother   . Stroke Paternal Grandmother   . Colon cancer Paternal Grandfather   . Breast cancer Cousin 35    No Known Allergies  Current Outpatient Medications on File Prior to Visit  Medication Sig Dispense Refill  . olopatadine (PATANOL) 0.1 % ophthalmic solution INSTILL 1 DROP INTO EACH EYE TWICE DAILY 5 mL 2  . levonorgestrel (MIRENA) 20 MCG/24HR IUD 1 Intra Uterine Device (1 each total) by Intrauterine route once for 1 dose. 1 each 0   No current facility-administered medications on file prior to visit.    BP 120/82   Pulse 82   Temp (!) 96.8 F (36 C) (Temporal)   Ht 5\' 2"  (1.575 m)   Wt 196 lb 8 oz (89.1 kg)   SpO2 98%   BMI 35.94 kg/m    Objective:   Physical Exam  Constitutional: She appears well-nourished.  Cardiovascular: Normal rate and regular rhythm.  Respiratory: Effort normal and breath sounds normal.  Musculoskeletal:     Cervical back: Neck supple.  Skin: Skin is warm and dry.           Assessment & Plan:

## 2019-11-17 NOTE — Assessment & Plan Note (Signed)
Stable in the office today on current regimen. Continue same. CMP pending.

## 2019-11-17 NOTE — Assessment & Plan Note (Signed)
A1C today of 7.8 which is an increase from 6.3 in June 2020. Discussed that our A1C goal for her is <7, offered to increase Metformin or Trulicity dose for which she kindly declines.  She would like to work on her diet and exercise. She will schedule her eye exam. Foot exam due next visit. Managed on ARB. Lipid panel pending. Not on statin therapy.  Follow up in 6 months.

## 2019-11-26 DIAGNOSIS — F329 Major depressive disorder, single episode, unspecified: Secondary | ICD-10-CM

## 2019-11-26 DIAGNOSIS — F419 Anxiety disorder, unspecified: Secondary | ICD-10-CM

## 2019-11-27 ENCOUNTER — Other Ambulatory Visit: Payer: Self-pay | Admitting: Primary Care

## 2019-11-27 NOTE — Telephone Encounter (Signed)
PHQ 9 score of 6 and GAD 7 score of 8. Will input during an open encounter.

## 2019-11-28 MED ORDER — SERTRALINE HCL 50 MG PO TABS: 50.0000 mg | ORAL_TABLET | Freq: Every day | ORAL | 1 refills | Status: DC

## 2019-11-28 NOTE — Telephone Encounter (Signed)
Patient left a voicemail stating that she has been sending [employer] back and forth with Mayra Reel NP. Patient stated that a script for Sertraline was suppose to be sent to the pharmacy for her, but she has not received it yet. 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

## 2020-01-02 DIAGNOSIS — R05 Cough: Secondary | ICD-10-CM

## 2020-01-02 DIAGNOSIS — R059 Cough, unspecified: Secondary | ICD-10-CM

## 2020-01-02 MED ORDER — BENZONATATE 200 MG PO CAPS: 200.0000 mg | ORAL_CAPSULE | Freq: Three times a day (TID) | ORAL | 0 refills | Status: DC | PRN

## 2020-01-16 ENCOUNTER — Other Ambulatory Visit: Payer: Self-pay | Admitting: Primary Care

## 2020-01-16 DIAGNOSIS — F419 Anxiety disorder, unspecified: Secondary | ICD-10-CM

## 2020-05-21 ENCOUNTER — Ambulatory Visit: Payer: BC Managed Care – PPO | Admitting: Primary Care

## 2020-05-24 ENCOUNTER — Other Ambulatory Visit: Payer: Self-pay

## 2020-05-24 ENCOUNTER — Ambulatory Visit (INDEPENDENT_AMBULATORY_CARE_PROVIDER_SITE_OTHER): Payer: BC Managed Care – PPO | Admitting: Primary Care

## 2020-05-24 ENCOUNTER — Encounter: Payer: Self-pay | Admitting: Primary Care

## 2020-05-24 VITALS — BP 120/72 | HR 78 | Temp 97.6°F | Ht 62.0 in | Wt 154.0 lb

## 2020-05-24 DIAGNOSIS — E119 Type 2 diabetes mellitus without complications: Secondary | ICD-10-CM

## 2020-05-24 DIAGNOSIS — I10 Essential (primary) hypertension: Secondary | ICD-10-CM | POA: Diagnosis not present

## 2020-05-24 LAB — POCT GLYCOSYLATED HEMOGLOBIN (HGB A1C): Hemoglobin A1C: 5.1 % (ref 4.0–5.6)

## 2020-05-24 MED ORDER — OLMESARTAN MEDOXOMIL 20 MG PO TABS: 20.0000 mg | ORAL_TABLET | Freq: Every day | ORAL | 0 refills | Status: DC

## 2020-05-24 NOTE — Progress Notes (Signed)
Subjective:    Patient ID: Maria Mcbride, female    DOB: Feb 08, 1976, 44 y.o.   MRN: [id number]  HPI  This visit occurred during the SARS-CoV-2 public health emergency.  Safety protocols were in place, including screening questions prior to the visit, additional usage of staff PPE, and extensive cleaning of exam room while observing appropriate contact time as indicated for disinfecting solutions.   Maria Mcbride is a 44 year old female with a history of hypertension, type 2 diabetes who presents today for follow up of diabetes.  Current medications include: Metformin XR 500 mg daily, Trulicity 0.75 mg weekly. She stopped Trulicity in July 2021 due to drops in glucose with her weight loss. She has continued with metformin.  She is also taking 1/2 tablet of her olmesartan-HCTZ due to drops in blood pressure.   She is checking her blood glucose 1 times daily and is getting readings of:  AM fasting: 85-100  Last A1C: 7.8 in March 2021 Last Eye Exam: UTD Last Foot Exam:  Due Pneumonia Vaccination: Completed in 2020 ACE/ARB: Olmesartan  Statin: None.  BP Readings from Last 3 Encounters:  05/24/20 120/72  11/17/19 120/82  06/02/19 110/70   Wt Readings from Last 3 Encounters:  05/24/20 154 lb (69.9 kg)  11/17/19 196 lb 8 oz (89.1 kg)  06/02/19 192 lb (87.1 kg)   She has significantly changed her diet, is eating a high protein, lower carbohydrate diet.   Review of Systems  Respiratory: Negative for shortness of breath.   Cardiovascular: Negative for chest pain.  Skin: Negative for color change.  Neurological: Negative for dizziness and numbness.       Past Medical History:  Diagnosis Date  . Abscess of abdominal cavity (HCC)   . Diabetes mellitus without complication (HCC)    BORDERLINE  . Diverticulitis   . Diverticulitis large intestine 01/05/2016  . Diverticulitis of large intestine without perforation or abscess without bleeding   . GERD (gastroesophageal reflux disease)    . Hypertension    CONTROLLED ON MEDS  . Pneumonia FEB 26,2017   DR SAYS CLEAR  . Sleep apnea    PATIENT THINKS MAY HAVE/ HAS APPT WITH PULMONARY DR AT [facility]     Social History   Socioeconomic History  . Marital status: Married    Spouse name: Not on file  . Number of children: Not on file  . Years of education: Not on file  . Highest education level: Not on file  Occupational History  . Not on file  Tobacco Use  . Smoking status: Never Smoker  . Smokeless tobacco: Never Used  Vaping Use  . Vaping Use: Never used  Substance and Sexual Activity  . Alcohol use: No  . Drug use: No  . Sexual activity: Yes    Birth control/protection: I.U.D.    Comment: Mirena  Other Topics Concern  . Not on file  Social History Narrative   Married.   2 children.    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    Social Determinants of Health   Financial Resource Strain:   . Difficulty of Paying Living Expenses: Not on file  Food Insecurity:   . Worried About Programme researcher, broadcasting/film/video in the Last Year: Not on file  . Ran Out of Food in the Last Year: Not on file  Transportation Needs:   . Lack of Transportation (Medical): Not on file  . Lack of Transportation (Non-Medical):  Not on file  Physical Activity:   . Days of Exercise per Week: Not on file  . Minutes of Exercise per Session: Not on file  Stress: Stress Concern Present  . Feeling of Stress : Rather much  Social Connections:   . Frequency of Communication with Friends and Family: Not on file  . Frequency of Social Gatherings with Friends and Family: Not on file  . Attends Religious Services: Not on file  . Active Member of Clubs or Organizations: Not on file  . Attends Banker Meetings: Not on file  . Marital Status: Not on file  Intimate Partner Violence:   . Fear of Current or Ex-Partner: Not on file  . Emotionally Abused: Not on file  . Physically Abused: Not on file  . Sexually  Abused: Not on file    Past Surgical History:  Procedure Laterality Date  . CESAREAN SECTION  2007 and 2011  . CHOLECYSTECTOMY  2001   Dr. Michela Pitcher  . COLON RESECTION N/A 01/05/2016   Procedure: HAND ASSISTED LAPAROSCOPIC COLON RESECTION;  Surgeon: Leafy Ro, MD;  Location: [facility];  Service: General;  Laterality: N/A;  . COLONOSCOPY WITH PROPOFOL N/A 12/16/2015   Procedure: COLONOSCOPY WITH PROPOFOL;  Surgeon: Midge Minium, MD;  Location: [facility];  Service: Endoscopy;  Laterality: N/A;  DIABETIC/ ORAL MED/ IUD  . TRANS GLUTEAL DRAIN PLACEMENT     SEDATION    Family History  Problem Relation Age of Onset  . Hyperlipidemia Mother   . Sleep apnea Mother   . Hypertension Mother   . Hypothyroidism Sister   . Diabetes Maternal Grandmother   . Other Maternal Grandmother        cardiovascular disease  . Hypertension Maternal Grandmother   . Hypertension Paternal Grandmother   . Stroke Paternal Grandmother   . Colon cancer Paternal Grandfather   . Breast cancer Cousin 35    No Known Allergies  Current Outpatient Medications on File Prior to Visit  Medication Sig Dispense Refill  . levonorgestrel (MIRENA) 20 MCG/24HR IUD 1 Intra Uterine Device (1 each total) by Intrauterine route once for 1 dose. 1 each 0  . metFORMIN (GLUCOPHAGE-XR) 500 MG 24 hr tablet Take 1 tablet (500 mg total) by mouth daily with breakfast. For diabetes. 90 tablet 3  . olopatadine (PATANOL) 0.1 % ophthalmic solution INSTILL 1 DROP INTO EACH EYE TWICE DAILY 5 mL 2  . sertraline (ZOLOFT) 50 MG tablet Take 1 tablet by mouth once daily 30 tablet 5  . Dulaglutide (TRULICITY) 0.75 MG/0.5ML SOPN Inject 0.5 mLs into the skin once a week. For diabetes. (Patient not taking: Reported on 05/24/2020) 2 mL 5   No current facility-administered medications on file prior to visit.    BP 120/72   Pulse 78   Temp 97.6 F (36.4 C) (Temporal)   Ht 5\' 2"  (1.575 m)   Wt 154 lb (69.9 kg)   SpO2 98%   BMI 28.17 kg/m     Objective:   Physical Exam Cardiovascular:     Rate and Rhythm: Normal rate and regular rhythm.  Pulmonary:     Effort: Pulmonary effort is normal.     Breath sounds: Normal breath sounds.  Musculoskeletal:     Cervical back: Neck supple.  Skin:    General: Skin is warm and dry.  Psychiatric:        Mood and Affect: Mood normal.            Assessment &  Plan:

## 2020-05-24 NOTE — Patient Instructions (Signed)
Stop Metformin.   Stop olmesartan-hydrochlorothiazide. Start olmesartan 20 mg. You may have to cut this in half, please update me.  Continue to work on a healthy diet. Ensure you are consuming 64 ounces of water daily.  Please schedule a follow up appointment in 6 months for your physical.   It was a pleasure to see you today!

## 2020-05-24 NOTE — Assessment & Plan Note (Signed)
Well controlled in the office today on half dose of olmesartan-HCTZ. Will discontinue HCTZ portion, continue with olmesartan 20 mg. May have to have her cut 20 mg tablet in half, she will update.   New Rx sent to pharmacy.

## 2020-05-24 NOTE — Assessment & Plan Note (Signed)
Significant improvement in A1C with reduction to 5.1!! Commended her on weight loss through dietary changes. Encouraged to continue.  Stop metformin. Continue off of Trulicity.  She will monitor glucose levels and update with readings begin to increase.   Foot exam today. Not on statin therapy. Managed on ARB. Eye exam UTD.  Foot exam today.

## 2020-06-09 ENCOUNTER — Ambulatory Visit: Payer: BC Managed Care – PPO | Admitting: Primary Care

## 2020-06-09 ENCOUNTER — Encounter: Payer: Self-pay | Admitting: Primary Care

## 2020-06-09 ENCOUNTER — Other Ambulatory Visit: Payer: Self-pay

## 2020-06-09 VITALS — BP 128/78 | HR 71 | Temp 97.1°F | Wt 152.5 lb

## 2020-06-09 DIAGNOSIS — Z8349 Family history of other endocrine, nutritional and metabolic diseases: Secondary | ICD-10-CM | POA: Insufficient documentation

## 2020-06-09 DIAGNOSIS — Z1231 Encounter for screening mammogram for malignant neoplasm of breast: Secondary | ICD-10-CM

## 2020-06-09 DIAGNOSIS — R222 Localized swelling, mass and lump, trunk: Secondary | ICD-10-CM | POA: Insufficient documentation

## 2020-06-09 NOTE — Assessment & Plan Note (Signed)
In sister. TSH pending for patient.

## 2020-06-09 NOTE — Patient Instructions (Signed)
[facility] to [facility].   You will be contacted regarding your ultrasound.  Please let us know if you have not been contacted within one week.  Stop by the lab prior to leaving today. I will notify you of your results once received.   It was a pleasure to see you today!
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
## 2020-06-09 NOTE — Assessment & Plan Note (Signed)
Unable to clearly identify the mass of concern. Will obtain ultrasound for further evaluation.  Orders placed.

## 2020-06-10 LAB — TSH: TSH: 1.32 u[IU]/mL (ref 0.35–4.50)

## 2020-06-22 ENCOUNTER — Ambulatory Visit
Admission: RE | Admit: 2020-06-22 | Discharge: 2020-06-22 | Disposition: A | Discharge status: Home / Self Care | Payer: BC Managed Care – PPO | Source: Ambulatory Visit | Attending: Primary Care | Admitting: Primary Care

## 2020-06-22 ENCOUNTER — Other Ambulatory Visit: Payer: Self-pay

## 2020-06-22 DIAGNOSIS — R222 Localized swelling, mass and lump, trunk: Secondary | ICD-10-CM | POA: Insufficient documentation

## 2020-07-05 ENCOUNTER — Ambulatory Visit
Admission: RE | Admit: 2020-07-05 | Discharge: 2020-07-05 | Disposition: A | Discharge status: Home / Self Care | Payer: BC Managed Care – PPO | Source: Ambulatory Visit | Attending: Primary Care | Admitting: Primary Care

## 2020-07-05 ENCOUNTER — Other Ambulatory Visit: Payer: Self-pay

## 2020-07-05 DIAGNOSIS — Z1231 Encounter for screening mammogram for malignant neoplasm of breast: Secondary | ICD-10-CM | POA: Diagnosis present

## 2020-07-08 ENCOUNTER — Other Ambulatory Visit: Payer: Self-pay | Admitting: Primary Care

## 2020-07-08 DIAGNOSIS — F32A Depression, unspecified: Secondary | ICD-10-CM

## 2020-08-09 ENCOUNTER — Other Ambulatory Visit: Payer: Self-pay | Admitting: Primary Care

## 2020-08-09 DIAGNOSIS — I10 Essential (primary) hypertension: Secondary | ICD-10-CM

## 2020-08-10 NOTE — Telephone Encounter (Signed)
Pharmacy requests refill on: Olmesartan Medoxomil 20 mg   LAST REFILL: 05/24/2020 (Q-30, R-0) LAST OV: 06/09/2020 NEXT OV: 11/22/2020 PHARMACY: [id number] [place], [place]

## 2020-11-22 ENCOUNTER — Encounter: Payer: BC Managed Care – PPO | Admitting: Primary Care

## 2020-11-24 ENCOUNTER — Encounter: Payer: BC Managed Care – PPO | Admitting: Primary Care

## 2020-12-09 ENCOUNTER — Other Ambulatory Visit: Payer: Self-pay | Admitting: Primary Care

## 2020-12-09 DIAGNOSIS — F32A Depression, unspecified: Secondary | ICD-10-CM

## 2020-12-14 ENCOUNTER — Other Ambulatory Visit: Payer: Self-pay | Admitting: Primary Care

## 2020-12-14 DIAGNOSIS — J302 Other seasonal allergic rhinitis: Secondary | ICD-10-CM

## 2020-12-15 NOTE — Telephone Encounter (Signed)
Left message to return call to our office.  Has not refilled in a year does she need?

## 2020-12-19 NOTE — Telephone Encounter (Signed)
Refills sent to pharmacy. 

## 2021-01-31 ENCOUNTER — Other Ambulatory Visit: Payer: Self-pay | Admitting: Primary Care

## 2021-01-31 DIAGNOSIS — F419 Anxiety disorder, unspecified: Secondary | ICD-10-CM

## 2021-01-31 DIAGNOSIS — F32A Depression, unspecified: Secondary | ICD-10-CM

## 2021-02-01 NOTE — Telephone Encounter (Signed)
Will you notify her that I'd like to see her in the office for diabetes check and follow up? Can she schedule for sometime in June or July?  Once she schedules the okay to send in sertraline as pended.

## 2021-02-09 NOTE — Telephone Encounter (Signed)
Left message to return call to our office.  

## 2021-02-10 NOTE — Telephone Encounter (Signed)
Ms. Hernandez called in returning JoEllen phone call. She set up an apt for 6/9 @920 

## 2021-02-17 ENCOUNTER — Ambulatory Visit: Payer: BC Managed Care – PPO | Admitting: Primary Care

## 2021-03-24 ENCOUNTER — Other Ambulatory Visit: Payer: Self-pay | Admitting: Primary Care

## 2021-03-24 DIAGNOSIS — I10 Essential (primary) hypertension: Secondary | ICD-10-CM

## 2021-03-24 NOTE — Telephone Encounter (Signed)
Please check to see if patient has been taking prescription.

## 2021-03-25 NOTE — Telephone Encounter (Signed)
Left message to return call to our office.  

## 2021-03-28 NOTE — Telephone Encounter (Signed)
Left message to return call to our office.  

## 2021-03-29 NOTE — Telephone Encounter (Signed)
Called patient she has been but she had extra and does not need refill at this time. Also requested appointment for DM follow up have made while talking to patient.

## 2021-05-04 ENCOUNTER — Other Ambulatory Visit: Payer: Self-pay

## 2021-05-04 ENCOUNTER — Encounter: Payer: Self-pay | Admitting: Primary Care

## 2021-05-04 ENCOUNTER — Ambulatory Visit: Payer: BC Managed Care – PPO | Admitting: Primary Care

## 2021-05-04 VITALS — BP 124/76 | HR 82 | Temp 98.2°F | Ht 62.0 in | Wt 189.0 lb

## 2021-05-04 DIAGNOSIS — I10 Essential (primary) hypertension: Secondary | ICD-10-CM

## 2021-05-04 DIAGNOSIS — R0683 Snoring: Secondary | ICD-10-CM | POA: Diagnosis not present

## 2021-05-04 DIAGNOSIS — Z0000 Encounter for general adult medical examination without abnormal findings: Secondary | ICD-10-CM

## 2021-05-04 DIAGNOSIS — Z1159 Encounter for screening for other viral diseases: Secondary | ICD-10-CM

## 2021-05-04 DIAGNOSIS — E785 Hyperlipidemia, unspecified: Secondary | ICD-10-CM

## 2021-05-04 DIAGNOSIS — Z114 Encounter for screening for human immunodeficiency virus [HIV]: Secondary | ICD-10-CM

## 2021-05-04 DIAGNOSIS — E119 Type 2 diabetes mellitus without complications: Secondary | ICD-10-CM | POA: Diagnosis not present

## 2021-05-04 LAB — LIPID PANEL
Cholesterol: 153 mg/dL (ref 0–200)
HDL: 52.1 mg/dL (ref >39.00)
LDL Cholesterol: 73 mg/dL (ref 0–99)
NonHDL: 100.64
Total CHOL/HDL Ratio: 3
Triglycerides: 139 mg/dL (ref 0.0–149.0)
VLDL: 27.8 mg/dL (ref 0.0–40.0)

## 2021-05-04 LAB — CBC
HCT: 39.5 % (ref 36.0–46.0)
Hemoglobin: 12.9 g/dL (ref 12.0–15.0)
MCHC: 32.6 g/dL (ref 30.0–36.0)
MCV: 88.1 fl (ref 78.0–100.0)
Platelets: 205 10*3/uL (ref 150.0–400.0)
RBC: 4.48 Mil/uL (ref 3.87–5.11)
RDW: 14.6 % (ref 11.5–15.5)
WBC: 8.6 10*3/uL (ref 4.0–10.5)

## 2021-05-04 LAB — COMPREHENSIVE METABOLIC PANEL
ALT: 16 U/L (ref 0–35)
AST: 15 U/L (ref 0–37)
Albumin: 4.3 g/dL (ref 3.5–5.2)
Alkaline Phosphatase: 60 U/L (ref 39–117)
BUN: 9 mg/dL (ref 6–23)
CO2: 28 mEq/L (ref 19–32)
Calcium: 9.1 mg/dL (ref 8.4–10.5)
Chloride: 101 mEq/L (ref 96–112)
Creatinine, Ser: 0.72 mg/dL (ref 0.40–1.20)
GFR: 100.45 mL/min (ref >60.00)
Glucose, Bld: 120 mg/dL — ABNORMAL HIGH (ref 70–99)
Potassium: 4 mEq/L (ref 3.5–5.1)
Sodium: 136 mEq/L (ref 135–145)
Total Bilirubin: 0.8 mg/dL (ref 0.2–1.2)
Total Protein: 7.2 g/dL (ref 6.0–8.3)

## 2021-05-04 LAB — HEMOGLOBIN A1C: Hgb A1c MFr Bld: 7.1 % — ABNORMAL HIGH (ref 4.6–6.5)

## 2021-05-04 NOTE — Assessment & Plan Note (Addendum)
Immunizations UTD. Pap smear UTD, follows with GYN.  Colonoscopy UTD and is due in 2027 as there is no mention of one needed sooner.  Discussed the importance of a healthy diet and regular exercise in order for weight loss, and to reduce the risk of further co-morbidity.  Exam today stable. Labs pending.

## 2021-05-04 NOTE — Patient Instructions (Signed)
Stop by the lab prior to leaving today. I will notify you of your results once received.   It was a pleasure to see you today!  Preventive Care 40-46 Years Old, Female Preventive care refers to lifestyle choices and visits with your health care provider that can promote health and wellness. This includes: A yearly physical exam. This is also called an annual wellness visit. Regular dental and eye exams. Immunizations. Screening for certain conditions. Healthy lifestyle choices, such as: Eating a healthy diet. Getting regular exercise. Not using drugs or products that contain nicotine and tobacco. Limiting alcohol use. What can I expect for my preventive care visit? Physical exam Your health care provider will check your: Height and weight. These may be used to calculate your BMI (body mass index). BMI is a measurement that tells if you are at a healthy weight. Heart rate and blood pressure. Body temperature. Skin for abnormal spots. Counseling Your health care provider may ask you questions about your: Past medical problems. Family's medical history. Alcohol, tobacco, and drug use. Emotional well-being. Home life and relationship well-being. Sexual activity. Diet, exercise, and sleep habits. Work and work environment. Access to firearms. Method of birth control. Menstrual cycle. Pregnancy history. What immunizations do I need?  Vaccines are usually given at various ages, according to a schedule. Your health care provider will recommend vaccines for you based on your age, medicalhistory, and lifestyle or other factors, such as travel or where you work. What tests do I need? Blood tests Lipid and cholesterol levels. These may be checked every 5 years, or more often if you are over 50 years old. Hepatitis C test. Hepatitis B test. Screening Lung cancer screening. You may have this screening every year starting at age 55 if you have a 30-pack-year history of smoking and  currently smoke or have quit within the past 15 years. Colorectal cancer screening. All adults should have this screening starting at age 50 and continuing until age 75. Your health care provider may recommend screening at age 45 if you are at increased risk. You will have tests every 1-10 years, depending on your results and the type of screening test. Diabetes screening. This is done by checking your blood sugar (glucose) after you have not eaten for a while (fasting). You may have this done every 1-3 years. Mammogram. This may be done every 1-2 years. Talk with your health care provider about when you should start having regular mammograms. This may depend on whether you have a family history of breast cancer. BRCA-related cancer screening. This may be done if you have a family history of breast, ovarian, tubal, or peritoneal cancers. Pelvic exam and Pap test. This may be done every 3 years starting at age 21. Starting at age 30, this may be done every 5 years if you have a Pap test in combination with an HPV test. Other tests STD (sexually transmitted disease) testing, if you are at risk. Bone density scan. This is done to screen for osteoporosis. You may have this scan if you are at high risk for osteoporosis. Talk with your health care provider about your test results, treatment options,and if necessary, the need for more tests. Follow these instructions at home: Eating and drinking  Eat a diet that includes fresh fruits and vegetables, whole grains, lean protein, and low-fat dairy products. Take vitamin and mineral supplements as recommended by your health care provider. Do not drink alcohol if: Your health care provider tells you not to drink.   You are pregnant, may be pregnant, or are planning to become pregnant. If you drink alcohol: Limit how much you have to 0-1 drink a day. Be aware of how much alcohol is in your drink. In the [place] one drink equals one 12 oz bottle of beer  (355 mL), one 5 oz glass of wine (148 mL), or one 1 oz glass of hard liquor (44 mL).  Lifestyle Take daily care of your teeth and gums. Brush your teeth every morning and night with fluoride toothpaste. Floss one time each day. Stay active. Exercise for at least 30 minutes 5 or more days each week. Do not use any products that contain nicotine or tobacco, such as cigarettes, e-cigarettes, and chewing tobacco. If you need help quitting, ask your health care provider. Do not use drugs. If you are sexually active, practice safe sex. Use a condom or other form of protection to prevent STIs (sexually transmitted infections). If you do not wish to become pregnant, use a form of birth control. If you plan to become pregnant, see your health care provider for a prepregnancy visit. If told by your health care provider, take low-dose aspirin daily starting at age 50. Find healthy ways to cope with stress, such as: Meditation, yoga, or listening to music. Journaling. Talking to a trusted person. Spending time with friends and family. Safety Always wear your seat belt while driving or riding in a vehicle. Do not drive: If you have been drinking alcohol. Do not ride with someone who has been drinking. When you are tired or distracted. While texting. Wear a helmet and other protective equipment during sports activities. If you have firearms in your house, make sure you follow all gun safety procedures. What's next? Visit your health care provider once a year for an annual wellness visit. Ask your health care provider how often you should have your eyes and teeth checked. Stay up to date on all vaccines. This information is not intended to replace advice given to you by your health care provider. Make sure you discuss any questions you have with your healthcare provider. Document Revised: 06/01/2020 Document Reviewed: 05/09/2018 Elsevier Patient Education  2022 Elsevier Inc.  

## 2021-05-04 NOTE — Assessment & Plan Note (Signed)
Repeat A1C pending. Not on treatment. Continue off treatment for now. Await results.  Managed on ARB, no statin. Eye exam due, she will schedule. Foot exam UTD.  Follow up in 6 months.

## 2021-05-04 NOTE — Progress Notes (Signed)
Subjective:    Patient ID: Maria Mcbride, female    DOB: 09-22-1975, 45 y.o.   MRN: [id number]  HPI  Maria Mcbride is a very pleasant 45 y.o. female who presents today for complete physical and follow up of chronic conditions.  Immunizations: -Tetanus: 2017 -Influenza: Due this season  -Covid-19: 3 vaccines -Pneumonia: 2020  Diet: Fair diet.  Exercise: No regular exercise, but has been active.  Eye exam: Completes annually, due. Dental exam: Completes semi-annually   Pap Smear: Completed in 2020 Mammogram: Completed in October 2021 Colonoscopy: Completed in 2017, due 2027  BP Readings from Last 3 Encounters:  05/04/21 124/76  06/09/20 128/78  05/24/20 120/72       Review of Systems  Constitutional:  Negative for unexpected weight change.  HENT:  Negative for rhinorrhea.   Respiratory:  Negative for shortness of breath.   Cardiovascular:  Negative for chest pain.  Gastrointestinal:  Negative for constipation and diarrhea.  Genitourinary:  Negative for difficulty urinating.  Musculoskeletal:  Negative for arthralgias and myalgias.  Skin:  Negative for rash.  Allergic/Immunologic: Negative for environmental allergies.  Neurological:  Negative for dizziness, numbness and headaches.  Psychiatric/Behavioral:  The patient is not nervous/anxious.         Past Medical History:  Diagnosis Date   Abscess of abdominal cavity (HCC)    Diabetes mellitus without complication (HCC)    BORDERLINE   Diverticulitis    Diverticulitis large intestine 01/05/2016   Diverticulitis of large intestine without perforation or abscess without bleeding    GERD (gastroesophageal reflux disease)    Hypertension    CONTROLLED ON MEDS   Pneumonia FEB 26,2017   DR SAYS CLEAR   Sleep apnea    PATIENT THINKS MAY HAVE/ HAS APPT WITH PULMONARY DR AT [facility]    Social History   Socioeconomic History   Marital status: Married    Spouse name: Not on file   Number of children: Not on file    Years of education: Not on file   Highest education level: Not on file  Occupational History   Not on file  Tobacco Use   Smoking status: Never   Smokeless tobacco: Never  Vaping Use   Vaping Use: Never used  Substance and Sexual Activity   Alcohol use: No   Drug use: No   Sexual activity: Yes    Birth control/protection: I.U.D.    Comment: Mirena  Other Topics Concern   Not on file  Social History Narrative   Married.   2 children.    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    Social Determinants of Health   Financial Resource Strain: Not on file  Food Insecurity: Not on file  Transportation Needs: Not on file  Physical Activity: Not on file  Stress: Not on file  Social Connections: Not on file  Intimate Partner Violence: Not on file    Past Surgical History:  Procedure Laterality Date   CESAREAN SECTION  2007 and 2011   CHOLECYSTECTOMY  2001   Dr. Michela Pitcher   COLON RESECTION N/A 01/05/2016   Procedure: HAND ASSISTED LAPAROSCOPIC COLON RESECTION;  Surgeon: Leafy Ro, MD;  Location: [facility];  Service: General;  Laterality: N/A;   COLONOSCOPY WITH PROPOFOL N/A 12/16/2015   Procedure: COLONOSCOPY WITH PROPOFOL;  Surgeon: Midge Minium, MD;  Location: [facility];  Service: Endoscopy;  Laterality: N/A;  DIABETIC/ ORAL MED/ IUD   TRANS GLUTEAL  DRAIN PLACEMENT     SEDATION    Family History  Problem Relation Age of Onset   Hyperlipidemia Mother    Sleep apnea Mother    Hypertension Mother    Hypothyroidism Sister    Diabetes Maternal Grandmother    Other Maternal Grandmother        cardiovascular disease   Hypertension Maternal Grandmother    Hypertension Paternal Grandmother    Stroke Paternal Grandmother    Colon cancer Paternal Grandfather    Breast cancer Cousin 35    No Known Allergies  Current Outpatient Medications on File Prior to Visit  Medication Sig Dispense Refill   olmesartan (BENICAR) 20 MG tablet Take 1  tablet by mouth once daily for blood pressure 30 tablet 5   olopatadine (PATANOL) 0.1 % ophthalmic solution INSTILL 1 DROP INTO EACH EYE TWICE DAILY 5 mL 0   sertraline (ZOLOFT) 50 MG tablet TAKE 1 TABLET BY MOUTH ONCE DAILY FOR  ANXIETY  /  DEPRESSION  WILL  NEED  TO  BE  SEEN  IN  OFFICE  FOR  MORE  REFILLS 90 tablet 0   levonorgestrel (MIRENA) 20 MCG/24HR IUD 1 Intra Uterine Device (1 each total) by Intrauterine route once for 1 dose. 1 each 0   No current facility-administered medications on file prior to visit.    BP 124/76   Pulse 82   Temp 98.2 F (36.8 C) (Temporal)   Ht 5\' 2"  (1.575 m)   Wt 189 lb (85.7 kg)   SpO2 97%   BMI 34.57 kg/m  Objective:   Physical Exam HENT:     Right Ear: Tympanic membrane and ear canal normal.     Left Ear: Tympanic membrane and ear canal normal.     Nose: Nose normal.  Eyes:     Conjunctiva/sclera: Conjunctivae normal.     Pupils: Pupils are equal, round, and reactive to light.  Neck:     Thyroid: No thyromegaly.  Cardiovascular:     Rate and Rhythm: Normal rate and regular rhythm.     Heart sounds: No murmur heard. Pulmonary:     Effort: Pulmonary effort is normal.     Breath sounds: Normal breath sounds. No rales.  Abdominal:     General: Bowel sounds are normal.     Palpations: Abdomen is soft.     Tenderness: There is no abdominal tenderness.  Musculoskeletal:        General: Normal range of motion.     Cervical back: Neck supple.  Lymphadenopathy:     Cervical: No cervical adenopathy.  Skin:    General: Skin is warm and dry.     Findings: No rash.  Neurological:     Mental Status: She is alert and oriented to person, place, and time.     Cranial Nerves: No cranial nerve deficit.     Deep Tendon Reflexes: Reflexes are normal and symmetric.  Psychiatric:        Mood and Affect: Mood normal.          Assessment & Plan:      This visit occurred during the SARS-CoV-2 public health emergency.  Safety protocols were  in place, including screening questions prior to the visit, additional usage of staff PPE, and extensive cleaning of exam room while observing appropriate contact time as indicated for disinfecting solutions.

## 2021-05-04 NOTE — Assessment & Plan Note (Signed)
Compliant to CPAP nightly, continue same.  

## 2021-05-04 NOTE — Assessment & Plan Note (Signed)
Well controlled in the office today, continue olmesartan 20 mg. CMP pending.  

## 2021-05-05 ENCOUNTER — Other Ambulatory Visit: Payer: Self-pay | Admitting: Primary Care

## 2021-05-05 DIAGNOSIS — I10 Essential (primary) hypertension: Secondary | ICD-10-CM

## 2021-05-05 DIAGNOSIS — F32A Depression, unspecified: Secondary | ICD-10-CM

## 2021-05-05 DIAGNOSIS — F419 Anxiety disorder, unspecified: Secondary | ICD-10-CM

## 2021-05-05 LAB — HEPATITIS C ANTIBODY
Hepatitis C Ab: NONREACTIVE
SIGNAL TO CUT-OFF: 0.01 (ref <1.00)

## 2021-05-05 LAB — HIV ANTIBODY (ROUTINE TESTING W REFLEX): HIV 1&2 Ab, 4th Generation: NONREACTIVE

## 2021-05-06 DIAGNOSIS — F419 Anxiety disorder, unspecified: Secondary | ICD-10-CM | POA: Insufficient documentation

## 2021-05-06 NOTE — Assessment & Plan Note (Signed)
Doing well on sertraline 50 mg, refills sent to pharmacy.

## 2021-06-08 ENCOUNTER — Other Ambulatory Visit: Payer: Self-pay | Admitting: Primary Care

## 2021-06-08 DIAGNOSIS — E119 Type 2 diabetes mellitus without complications: Secondary | ICD-10-CM

## 2021-06-08 MED ORDER — OZEMPIC (0.25 OR 0.5 MG/DOSE) 2 MG/1.5ML ~~LOC~~ SOPN: PEN_INJECTOR | SUBCUTANEOUS | 0 refills | Status: DC

## 2021-06-09 ENCOUNTER — Other Ambulatory Visit: Payer: Self-pay | Admitting: Primary Care

## 2021-06-09 DIAGNOSIS — E119 Type 2 diabetes mellitus without complications: Secondary | ICD-10-CM

## 2021-06-13 ENCOUNTER — Telehealth: Payer: Self-pay

## 2021-08-08 DIAGNOSIS — Z20828 Contact with and (suspected) exposure to other viral communicable diseases: Secondary | ICD-10-CM

## 2021-08-08 NOTE — Telephone Encounter (Signed)
Are you ok to treat or do you want to see her for testing?

## 2021-08-09 MED ORDER — OSELTAMIVIR PHOSPHATE 75 MG PO CAPS: 75.0000 mg | ORAL_CAPSULE | Freq: Every day | ORAL | 0 refills | Status: DC

## 2021-08-16 NOTE — Telephone Encounter (Signed)
Mirena rcvd/charged 05/05/2019

## 2021-08-23 DIAGNOSIS — G4733 Obstructive sleep apnea (adult) (pediatric): Secondary | ICD-10-CM | POA: Diagnosis not present

## 2021-09-23 DIAGNOSIS — G4733 Obstructive sleep apnea (adult) (pediatric): Secondary | ICD-10-CM | POA: Diagnosis not present

## 2021-10-24 DIAGNOSIS — G4733 Obstructive sleep apnea (adult) (pediatric): Secondary | ICD-10-CM | POA: Diagnosis not present

## 2021-10-26 ENCOUNTER — Other Ambulatory Visit: Payer: Self-pay | Admitting: Primary Care

## 2021-10-26 DIAGNOSIS — E119 Type 2 diabetes mellitus without complications: Secondary | ICD-10-CM

## 2021-10-27 NOTE — Telephone Encounter (Signed)
Patient is due for diabetes follow-up and needs to be scheduled ASAP, any day, any slot, anytime.

## 2021-10-28 NOTE — Telephone Encounter (Signed)
LMTCB to schedule follow up.

## 2021-12-19 DIAGNOSIS — H10013 Acute follicular conjunctivitis, bilateral: Secondary | ICD-10-CM | POA: Diagnosis not present

## 2022-01-11 ENCOUNTER — Encounter: Payer: Self-pay | Admitting: Primary Care

## 2022-01-11 ENCOUNTER — Ambulatory Visit: Payer: BC Managed Care – PPO | Admitting: Primary Care

## 2022-01-11 VITALS — BP 112/82 | HR 80 | Temp 98.4°F | Ht 62.0 in | Wt 193.6 lb

## 2022-01-11 DIAGNOSIS — E119 Type 2 diabetes mellitus without complications: Secondary | ICD-10-CM | POA: Diagnosis not present

## 2022-01-11 DIAGNOSIS — J3489 Other specified disorders of nose and nasal sinuses: Secondary | ICD-10-CM | POA: Diagnosis not present

## 2022-01-11 LAB — POCT GLYCOSYLATED HEMOGLOBIN (HGB A1C): Hemoglobin A1C: 5.9 % — AB (ref 4.0–5.6)

## 2022-01-11 MED ORDER — OZEMPIC (0.25 OR 0.5 MG/DOSE) 2 MG/1.5ML ~~LOC~~ SOPN: 0.5000 mg | PEN_INJECTOR | SUBCUTANEOUS | 1 refills | Status: DC

## 2022-01-11 MED ORDER — AMOXICILLIN-POT CLAVULANATE 875-125 MG PO TABS: 1.0000 | ORAL_TABLET | Freq: Two times a day (BID) | ORAL | 0 refills | Status: DC

## 2022-01-11 NOTE — Assessment & Plan Note (Signed)
3-week history without improvement is convincing for bacterial sinus involvement. ? ?Prescription for Augmentin twice daily x7 days sent to pharmacy.  We also discussed use of Flonase. ? ?She will update if no improvement. ?

## 2022-01-11 NOTE — Progress Notes (Signed)
? ?Subjective:  ? ? Patient ID: Maria Mcbride, female    DOB: 11-Jul-1976, 46 y.o.   MRN: [id number] ? ?HPI ? ?Maria Mcbride is a very pleasant 46 y.o. female with a history of type 2 diabetes, hypertension who presents today for follow-up of diabetes. She would also like to discuss sinus pressure.  ? ?1) Type 2 Diabetes: Current medications include: Ozempic 0.5 mg weekly.  ? ?She is checking her blood glucose on occasion which run 130's-140's.  ? ?Last A1C: 7.1 in August 2022, 5.9 today ?Last Eye Exam: Due, scheduled  ?Last Foot Exam: Due ?Pneumonia Vaccination: 2020 ?Urine Microalbumin: None.  Managed on olmesartan ?Statin: None.  ? ?Dietary changes since last visit: None. No longer on Optavia. Working on low carb version.  ? ? ?Exercise: None ? ?2) Sinus Pressure/Eye Swelling: Acute for the last three weeks. Treated three weeks ago by a Teledoc that day with Oflaxicin, saw her eye doctor and was treated with Topradex.  ? ?She continues to notice watery eyes, sinus pressure, waking up with eyes crusted, post nasal drip.  Her sinus pressure is primarily located to the left maxillary region.  She is also noticed dental pain without abnormality in her oral cavity. ? ?She's tried Zyrtec, Claritin, Allegra but this hasn't helped but hasn't taken very long as it causes drowsiness.  She has not tried [employer]. ? ? ? ?Review of Systems  ?HENT:  Positive for sinus pressure and sinus pain.   ?Eyes:  Positive for discharge. Negative for itching.  ?Respiratory:  Negative for cough and shortness of breath.   ?Allergic/Immunologic: Positive for environmental allergies.  ? ?   ? ? ?Past Medical History:  ?Diagnosis Date  ? Abscess of abdominal cavity (HCC)   ? Diabetes mellitus without complication (HCC)   ? BORDERLINE  ? Diverticulitis   ? Diverticulitis large intestine 01/05/2016  ? Diverticulitis of colon   ? Diverticulitis of large intestine without perforation or abscess without bleeding   ? GERD (gastroesophageal reflux  disease)   ? Hypertension   ? CONTROLLED ON MEDS  ? Pneumonia FEB 26,2017  ? DR SAYS CLEAR  ? Sleep apnea   ? PATIENT THINKS MAY HAVE/ HAS APPT WITH PULMONARY DR AT [facility]  ? ? ?Social History  ? ?Socioeconomic History  ? Marital status: Married  ?  Spouse name: Not on file  ? Number of children: Not on file  ? Years of education: Not on file  ? Highest education level: Not on file  ?Occupational History  ? Not on file  ?Tobacco Use  ? Smoking status: Never  ? Smokeless tobacco: Never  ?Vaping Use  ? Vaping Use: Never used  ?Substance and Sexual Activity  ? Alcohol use: No  ? Drug use: No  ? Sexual activity: Yes  ?  Birth control/protection: I.U.D.  ?  Comment: Mirena  ?Other Topics Concern  ? Not on file  ?Social History Narrative  ? Married.  ? 2 children.   ? Works as a Teacher, early years/pre.  ? Enjoys camping, being out doors, water skiiing.   ? ?Social Determinants of Health  ? ?Financial Resource Strain: Not on file  ?Food Insecurity: Not on file  ?Transportation Needs: Not on file  ?Physical Activity: Not on file  ?Stress: Not on file  ?Social Connections: Not on file  ?Intimate Partner Violence: Not on file  ? ? ?Past Surgical History:  ?Procedure Laterality Date  ? CESAREAN SECTION  2007 and 2011  ?  CHOLECYSTECTOMY  2001  ? Dr. Michela Pitcher  ? COLON RESECTION N/A 01/05/2016  ? Procedure: HAND ASSISTED LAPAROSCOPIC COLON RESECTION;  Surgeon: Leafy Ro, MD;  Location: [facility];  Service: General;  Laterality: N/A;  ? COLONOSCOPY WITH PROPOFOL N/A 12/16/2015  ? Procedure: COLONOSCOPY WITH PROPOFOL;  Surgeon: Midge Minium, MD;  Location: [facility];  Service: Endoscopy;  Laterality: N/A;  DIABETIC/ ORAL MED/ IUD  ? TRANS GLUTEAL DRAIN PLACEMENT    ? SEDATION  ? ? ?Family History  ?Problem Relation Age of Onset  ? Hyperlipidemia Mother   ? Sleep apnea Mother   ? Hypertension Mother   ? Hypothyroidism Sister   ? Diabetes Maternal Grandmother   ? Other Maternal Grandmother   ?     cardiovascular disease  ? Hypertension Maternal  Grandmother   ? Hypertension Paternal Grandmother   ? Stroke Paternal Grandmother   ? Colon cancer Paternal Grandfather   ? Breast cancer Cousin 35  ? ? ?No Known Allergies ? ?Current Outpatient Medications on File Prior to Visit  ?Medication Sig Dispense Refill  ? olmesartan (BENICAR) 20 MG tablet Take 1 tablet by mouth once daily for blood pressure 90 tablet 3  ? Semaglutide,0.25 or 0.5MG /DOS, (OZEMPIC, 0.25 OR 0.5 MG/DOSE,) 2 MG/1.5ML SOPN Inject 0.5 mg into the skin once a week. For diabetes. Office visit required for further refills. 1.5 mL 0  ? sertraline (ZOLOFT) 50 MG tablet Take 1 tablet (50 mg total) by mouth daily. For anxiety and depression. 90 tablet 3  ? levonorgestrel (MIRENA) 20 MCG/24HR IUD 1 Intra Uterine Device (1 each total) by Intrauterine route once for 1 dose. 1 each 0  ? olopatadine (PATANOL) 0.1 % ophthalmic solution INSTILL 1 DROP INTO EACH EYE TWICE DAILY (Patient not taking: Reported on 01/11/2022) 5 mL 0  ? ?No current facility-administered medications on file prior to visit.  ? ? ?BP 112/82   Pulse 80   Temp 98.4 ?F (36.9 ?C)   Ht 5\' 2"  (1.575 m)   Wt 193 lb 9.6 oz (87.8 kg)   SpO2 97%   BMI 35.41 kg/m?  ?Objective:  ? Physical Exam ?HENT:  ?   Nose:  ?   Left Sinus: Maxillary sinus tenderness present.  ?Cardiovascular:  ?   Rate and Rhythm: Normal rate and regular rhythm.  ?Pulmonary:  ?   Effort: Pulmonary effort is normal.  ?   Breath sounds: Normal breath sounds.  ?Musculoskeletal:  ?   Cervical back: Neck supple.  ?Lymphadenopathy:  ?   Cervical: No cervical adenopathy.  ?Skin: ?   General: Skin is warm and dry.  ? ? ? ? ? ?   ?Assessment & Plan:  ? ? ? ? ?This visit occurred during the SARS-CoV-2 public health emergency.  Safety protocols were in place, including screening questions prior to the visit, additional usage of staff PPE, and extensive cleaning of exam room while observing appropriate contact time as indicated for disinfecting solutions.  ?

## 2022-01-11 NOTE — Assessment & Plan Note (Signed)
Controlled with A1c of 5.9 today! ? ?Continue Ozempic 0.5 mg weekly.  Refill sent to pharmacy. ? ?Foot exam today. ?Eye exam is scheduled. ?Pneumonia vaccine up-to-date. ?Managed on ARB. ? ?Follow-up in 6 months ?

## 2022-01-11 NOTE — Patient Instructions (Signed)
Start Augmentin antibiotics for the infection Take 1 tablet by mouth twice daily for 7 days. ? ?Continue Ozempic 0.5 mg weekly for diabetes. ? ?Please schedule your physical for 6 months. ? ?It was a pleasure to see you today! ? ? ?

## 2022-01-25 LAB — HM DIABETES EYE EXAM

## 2022-02-01 ENCOUNTER — Other Ambulatory Visit: Payer: Self-pay | Admitting: Primary Care

## 2022-02-01 DIAGNOSIS — F419 Anxiety disorder, unspecified: Secondary | ICD-10-CM

## 2022-02-01 DIAGNOSIS — F32A Depression, unspecified: Secondary | ICD-10-CM

## 2022-02-02 ENCOUNTER — Other Ambulatory Visit: Payer: Self-pay | Admitting: Primary Care

## 2022-02-02 DIAGNOSIS — F419 Anxiety disorder, unspecified: Secondary | ICD-10-CM

## 2022-02-02 NOTE — Telephone Encounter (Signed)
Received refill request for sertraline 50 mg daily, however I sent a 1 year supply in August 2022.  She should have another refill on file.

## 2022-02-09 NOTE — Telephone Encounter (Signed)
Called patient she is aware it was auto request . Will call when she needs refill.

## 2022-03-09 ENCOUNTER — Encounter: Payer: Self-pay | Admitting: Primary Care

## 2022-03-23 DIAGNOSIS — G4733 Obstructive sleep apnea (adult) (pediatric): Secondary | ICD-10-CM | POA: Diagnosis not present

## 2022-04-23 DIAGNOSIS — G4733 Obstructive sleep apnea (adult) (pediatric): Secondary | ICD-10-CM | POA: Diagnosis not present

## 2022-04-24 ENCOUNTER — Other Ambulatory Visit: Payer: Self-pay | Admitting: Primary Care

## 2022-04-24 DIAGNOSIS — F419 Anxiety disorder, unspecified: Secondary | ICD-10-CM

## 2022-05-06 DIAGNOSIS — E1165 Type 2 diabetes mellitus with hyperglycemia: Secondary | ICD-10-CM

## 2022-05-08 MED ORDER — SEMAGLUTIDE (1 MG/DOSE) 4 MG/3ML ~~LOC~~ SOPN: 1.0000 mg | PEN_INJECTOR | SUBCUTANEOUS | 0 refills | Status: DC

## 2022-05-24 DIAGNOSIS — G4733 Obstructive sleep apnea (adult) (pediatric): Secondary | ICD-10-CM | POA: Diagnosis not present

## 2022-07-14 ENCOUNTER — Ambulatory Visit (INDEPENDENT_AMBULATORY_CARE_PROVIDER_SITE_OTHER): Payer: BC Managed Care – PPO | Admitting: Primary Care

## 2022-07-14 ENCOUNTER — Other Ambulatory Visit: Payer: Self-pay | Admitting: Primary Care

## 2022-07-14 ENCOUNTER — Encounter: Payer: Self-pay | Admitting: Primary Care

## 2022-07-14 VITALS — BP 124/80 | HR 80 | Temp 98.4°F | Ht 62.0 in | Wt 191.0 lb

## 2022-07-14 DIAGNOSIS — Z1231 Encounter for screening mammogram for malignant neoplasm of breast: Secondary | ICD-10-CM

## 2022-07-14 DIAGNOSIS — F32A Depression, unspecified: Secondary | ICD-10-CM

## 2022-07-14 DIAGNOSIS — Z0000 Encounter for general adult medical examination without abnormal findings: Secondary | ICD-10-CM | POA: Diagnosis not present

## 2022-07-14 DIAGNOSIS — E1165 Type 2 diabetes mellitus with hyperglycemia: Secondary | ICD-10-CM

## 2022-07-14 DIAGNOSIS — I10 Essential (primary) hypertension: Secondary | ICD-10-CM

## 2022-07-14 DIAGNOSIS — E785 Hyperlipidemia, unspecified: Secondary | ICD-10-CM | POA: Diagnosis not present

## 2022-07-14 DIAGNOSIS — F419 Anxiety disorder, unspecified: Secondary | ICD-10-CM

## 2022-07-14 LAB — LIPID PANEL
Cholesterol: 149 mg/dL (ref 0–200)
HDL: 48.1 mg/dL (ref >39.00)
LDL Cholesterol: 73 mg/dL (ref 0–99)
NonHDL: 101.09
Total CHOL/HDL Ratio: 3
Triglycerides: 141 mg/dL (ref 0.0–149.0)
VLDL: 28.2 mg/dL (ref 0.0–40.0)

## 2022-07-14 LAB — MICROALBUMIN / CREATININE URINE RATIO
Creatinine,U: 158.2 mg/dL
Microalb Creat Ratio: 0.8 mg/g (ref 0.0–30.0)
Microalb, Ur: 1.2 mg/dL (ref 0.0–1.9)

## 2022-07-14 LAB — COMPREHENSIVE METABOLIC PANEL
ALT: 17 U/L (ref 0–35)
AST: 13 U/L (ref 0–37)
Albumin: 4.3 g/dL (ref 3.5–5.2)
Alkaline Phosphatase: 60 U/L (ref 39–117)
BUN: 11 mg/dL (ref 6–23)
CO2: 29 mEq/L (ref 19–32)
Calcium: 9.2 mg/dL (ref 8.4–10.5)
Chloride: 101 mEq/L (ref 96–112)
Creatinine, Ser: 0.76 mg/dL (ref 0.40–1.20)
GFR: 93.35 mL/min (ref >60.00)
Glucose, Bld: 131 mg/dL — ABNORMAL HIGH (ref 70–99)
Potassium: 4.1 mEq/L (ref 3.5–5.1)
Sodium: 138 mEq/L (ref 135–145)
Total Bilirubin: 0.5 mg/dL (ref 0.2–1.2)
Total Protein: 6.8 g/dL (ref 6.0–8.3)

## 2022-07-14 LAB — HEMOGLOBIN A1C: Hgb A1c MFr Bld: 6.6 % — ABNORMAL HIGH (ref 4.6–6.5)

## 2022-07-14 MED ORDER — TRULICITY 0.75 MG/0.5ML ~~LOC~~ SOAJ: 0.7500 mg | SUBCUTANEOUS | 1 refills | Status: DC

## 2022-07-14 NOTE — Patient Instructions (Signed)
Stop by the lab prior to leaving today. I will notify you of your results once received.   [facility] to [facility] your mamm[facility].   Please schedule a follow up visit for 6 months for a diabetes check.  It was a pleasure to see you today!  Preventive Care 52-47 Years Old, Female Preventive care refers to lifestyle choices and visits with your health care provider that can promote health and wellness. Preventive care visits are also called wellness exams. What can I expect for my preventive care visit? Counseling Your health care provider may ask you questions about your: Medical history, including: Past medical problems. Family medical history. Pregnancy history. Current health, including: Menstrual cycle. Method of birth control. Emotional well-being. Home life and relationship well-being. Sexual activity and sexual health. Lifestyle, including: Alcohol, nicotine or tobacco, and drug use. Access to firearms. Diet, exercise, and sleep habits. Work and work Statistician. Sunscreen use. Safety issues such as seatbelt and bike helmet use. Physical exam Your health care provider will check your: Height and weight. These may be used to calculate your BMI (body mass index). BMI is a measurement that tells if you are at a healthy weight. Waist circumference. This measures the distance around your waistline. This measurement also tells if you are at a healthy weight and may help predict your risk of certain diseases, such as type 2 diabetes and high blood pressure. Heart rate and blood pressure. Body temperature. Skin for abnormal spots. What immunizations do I need?  Vaccines are usually given at various ages, according to a schedule. Your health care provider will recommend vaccines for you based on your age, medical history, and lifestyle or other factors, such as travel or where you work. What tests do I need? Screening Your health care provider may recommend screening  tests for certain conditions. This may include: Lipid and cholesterol levels. Diabetes screening. This is done by checking your blood sugar (glucose) after you have not eaten for a while (fasting). Pelvic exam and Pap test. Hepatitis B test. Hepatitis C test. HIV (human immunodeficiency virus) test. STI (sexually transmitted infection) testing, if you are at risk. Lung cancer screening. Colorectal cancer screening. Mammogram. Talk with your health care provider about when you should start having regular mammograms. This may depend on whether you have a family history of breast cancer. BRCA-related cancer screening. This may be done if you have a family history of breast, ovarian, tubal, or peritoneal cancers. Bone density scan. This is done to screen for osteoporosis. Talk with your health care provider about your test results, treatment options, and if necessary, the need for more tests. Follow these instructions at home: Eating and drinking  Eat a diet that includes fresh fruits and vegetables, whole grains, lean protein, and low-fat dairy products. Take vitamin and mineral supplements as recommended by your health care provider. Do not drink alcohol if: Your health care provider tells you not to drink. You are pregnant, may be pregnant, or are planning to become pregnant. If you drink alcohol: Limit how much you have to 0-1 drink a day. Know how much alcohol is in your drink. In the [place] one drink equals one 12 oz bottle of beer (355 mL), one 5 oz glass of wine (148 mL), or one 1 oz glass of hard liquor (44 mL). Lifestyle Brush your teeth every morning and night with fluoride toothpaste. Floss one time each day. Exercise for at least 30 minutes 5 or more days each week. Do  not use any products that contain nicotine or tobacco. These products include cigarettes, chewing tobacco, and vaping devices, such as e-cigarettes. If you need help quitting, ask your health care provider. Do not  use drugs. If you are sexually active, practice safe sex. Use a condom or other form of protection to prevent STIs. If you do not wish to become pregnant, use a form of birth control. If you plan to become pregnant, see your health care provider for a prepregnancy visit. Take aspirin only as told by your health care provider. Make sure that you understand how much to take and what form to take. Work with your health care provider to find out whether it is safe and beneficial for you to take aspirin daily. Find healthy ways to manage stress, such as: Meditation, yoga, or listening to music. Journaling. Talking to a trusted person. Spending time with friends and family. Minimize exposure to UV radiation to reduce your risk of skin cancer. Safety Always wear your seat belt while driving or riding in a vehicle. Do not drive: If you have been drinking alcohol. Do not ride with someone who has been drinking. When you are tired or distracted. While texting. If you have been using any mind-altering substances or drugs. Wear a helmet and other protective equipment during sports activities. If you have firearms in your house, make sure you follow all gun safety procedures. Seek help if you have been physically or sexually abused. What's next? Visit your health care provider once a year for an annual wellness visit. Ask your health care provider how often you should have your eyes and teeth checked. Stay up to date on all vaccines. This information is not intended to replace advice given to you by your health care provider. Make sure you discuss any questions you have with your health care provider. Document Revised: 02/23/2021 Document Reviewed: 02/23/2021 Elsevier Patient Education  [place].

## 2022-07-14 NOTE — Assessment & Plan Note (Signed)
Controlled.  Continue sertraline 50 mg daily. 

## 2022-07-14 NOTE — Assessment & Plan Note (Signed)
Immunizations UTD. Pap smear due, she will schedule with GYN. Mammogram due, orders placed. Colonoscopy UTD, due 2027  Discussed the importance of a healthy diet and regular exercise in order for weight loss, and to reduce the risk of further co-morbidity.  Exam stable. Labs pending.  Follow up in 1 year for repeat physical.

## 2022-07-14 NOTE — Progress Notes (Signed)
Subjective:    Patient ID: Maria Mcbride, female    DOB: 16-Jul-1976, 46 y.o.   MRN: [id number]  HPI  Maria Mcbride is a very pleasant 46 y.o. female who presents today for complete physical and follow up of chronic conditions.  Immunizations: -Tetanus: 2017 -Influenza: Completed this season  -Pneumonia: 2022  Diet: Fair diet.  Exercise: No regular exercise.  Eye exam: Completes annually  Dental exam: Completes semi-annually   Pap Smear: Completed in 2020, follows with GYN Mammogram: Completed in October 2021  Colonoscopy: Completed in 2017, due 2027  BP Readings from Last 3 Encounters:  07/14/22 124/80  01/11/22 112/82  05/04/21 124/76         Review of Systems  Constitutional:  Negative for unexpected weight change.  HENT:  Negative for rhinorrhea.   Respiratory:  Negative for cough and shortness of breath.   Cardiovascular:  Negative for chest pain.  Gastrointestinal:  Negative for constipation and diarrhea.  Genitourinary:  Negative for difficulty urinating.  Musculoskeletal:  Negative for arthralgias.  Skin:  Negative for rash.  Allergic/Immunologic: Negative for environmental allergies.  Neurological:  Negative for dizziness, numbness and headaches.  Psychiatric/Behavioral:  The patient is not nervous/anxious.          Past Medical History:  Diagnosis Date   Abscess of abdominal cavity ([place])    Diabetes mellitus without complication ([place])    BORDERLINE   Diverticulitis    Diverticulitis large intestine 01/05/2016   Diverticulitis of colon    Diverticulitis of large intestine without perforation or abscess without bleeding    GERD (gastroesophageal reflux disease)    Hypertension    CONTROLLED ON MEDS   Pneumonia FEB 26,2017   DR SAYS CLEAR   Sleep apnea    PATIENT THINKS MAY HAVE/ HAS APPT WITH PULMONARY DR AT [facility]    Social History   Socioeconomic History   Marital status: Married    Spouse name: Not on file   Number of children: Not  on file   Years of education: Not on file   Highest education level: Not on file  Occupational History   Not on file  Tobacco Use   Smoking status: Never   Smokeless tobacco: Never  Vaping Use   Vaping Use: Never used  Substance and Sexual Activity   Alcohol use: No   Drug use: No   Sexual activity: Yes    Birth control/protection: I.U.D.    Comment: Mirena  Other Topics Concern   Not on file  Social History Narrative   Married.   2 children.    Works as a Software engineer.   Enjoys camping, being out doors, Print production planner.    Social Determinants of Health   Financial Resource Strain: Not on file  Food Insecurity: Not on file  Transportation Needs: Not on file  Physical Activity: Not on file  Stress: Stress Concern Present (11/27/2019)   [place]    Feeling of Stress : Rather much  Social Connections: Not on file  Intimate Partner Violence: Not on file    Past Surgical History:  Procedure Laterality Date   CESAREAN SECTION  2007 and 2011   CHOLECYSTECTOMY  2001   Dr. Pat Patrick   COLON RESECTION N/A 01/05/2016   Procedure: HAND ASSISTED LAPAROSCOPIC COLON RESECTION;  Surgeon: Jules Husbands, MD;  Location: [facility] ORS;  Service: General;  Laterality: N/A;   COLONOSCOPY WITH PROPOFOL N/A 12/16/2015   Procedure: COLONOSCOPY  WITH PROPOFOL;  Surgeon: Midge Minium, MD;  Location: [facility];  Service: Endoscopy;  Laterality: N/A;  DIABETIC/ ORAL MED/ IUD   TRANS GLUTEAL DRAIN PLACEMENT     SEDATION    Family History  Problem Relation Age of Onset   Hyperlipidemia Mother    Sleep apnea Mother    Hypertension Mother    Hypothyroidism Sister    Diabetes Maternal Grandmother    Other Maternal Grandmother        cardiovascular disease   Hypertension Maternal Grandmother    Hypertension Paternal Grandmother    Stroke Paternal Grandmother    Colon cancer Paternal Grandfather    Breast cancer Cousin 35    No  Known Allergies  Current Outpatient Medications on File Prior to Visit  Medication Sig Dispense Refill   olmesartan (BENICAR) 20 MG tablet Take 1 tablet by mouth once daily for blood pressure 90 tablet 3   olopatadine (PATANOL) 0.1 % ophthalmic solution INSTILL 1 DROP INTO EACH EYE TWICE DAILY 5 mL 0   Semaglutide, 1 MG/DOSE, 4 MG/3ML SOPN Inject 1 mg as directed once a week. for diabetes. 9 mL 0   sertraline (ZOLOFT) 50 MG tablet TAKE 1 TABLET BY MOUTH ONCE DAILY FOR ANXIETY AND FOR DEPRESSION 90 tablet 0   levonorgestrel (MIRENA) 20 MCG/24HR IUD 1 Intra Uterine Device (1 each total) by Intrauterine route once for 1 dose. 1 each 0   No current facility-administered medications on file prior to visit.    BP 124/80   Pulse 80   Temp 98.4 F (36.9 C) (Temporal)   Ht 5\' 2"  (1.575 m)   Wt 191 lb (86.6 kg)   SpO2 98%   BMI 34.93 kg/m  Objective:   Physical Exam HENT:     Right Ear: Tympanic membrane and ear canal normal.     Left Ear: Tympanic membrane and ear canal normal.     Nose: Nose normal.  Eyes:     Conjunctiva/sclera: Conjunctivae normal.     Pupils: Pupils are equal, round, and reactive to light.  Neck:     Thyroid: No thyromegaly.  Cardiovascular:     Rate and Rhythm: Normal rate and regular rhythm.     Heart sounds: No murmur heard. Pulmonary:     Effort: Pulmonary effort is normal.     Breath sounds: Normal breath sounds. No rales.  Abdominal:     General: Bowel sounds are normal.     Palpations: Abdomen is soft.     Tenderness: There is no abdominal tenderness.  Musculoskeletal:        General: Normal range of motion.     Cervical back: Neck supple.  Lymphadenopathy:     Cervical: No cervical adenopathy.  Skin:    General: Skin is warm and dry.     Findings: No rash.  Neurological:     Mental Status: She is alert and oriented to person, place, and time.     Cranial Nerves: No cranial nerve deficit.     Deep Tendon Reflexes: Reflexes are normal and  symmetric.  Psychiatric:        Mood and Affect: Mood normal.           Assessment & Plan:   Problem List Items Addressed This Visit       Cardiovascular and Mediastinum   Essential hypertension    Controlled.  Continue olmesartan 20 mg daily. CMP pending.      Relevant Orders   Comprehensive metabolic panel  Endocrine   Type 2 diabetes mellitus with hyperglycemia (HCC)    Repeat A1C pending.  She would like to switch back to Trulicity 0.75 mg weekly. Await A1C results. Stop Ozempic 1 mg weekly, will start Trulicity 0.75 mg weekly.  Urine microalbumin due and pending.  Not on statin therapy. Pneumonia vaccine UTD.  Follow up in 6 months.      Relevant Orders   Microalbumin/Creatinine Ratio, Urine   Hemoglobin A1c     Other   Preventative health care - Primary    Immunizations UTD. Pap smear due, she will schedule with GYN. Mammogram due, orders placed. Colonoscopy UTD, due 2027  Discussed the importance of a healthy diet and regular exercise in order for weight loss, and to reduce the risk of further co-morbidity.  Exam stable. Labs pending.  Follow up in 1 year for repeat physical.       Anxiety and depression    Controlled.  Continue sertraline 50 mg daily.      Other Visit Diagnoses     Hyperlipidemia, unspecified hyperlipidemia type       Relevant Orders   Lipid panel   Encounter for screening mammogram for malignant neoplasm of breast       Relevant Orders   MM 3D SCREEN BREAST BILATERAL          Maria Nest, NP

## 2022-07-14 NOTE — Assessment & Plan Note (Signed)
Controlled.  Continue olmesartan 20 mg daily CMP pending. 

## 2022-07-14 NOTE — Assessment & Plan Note (Signed)
Repeat A1C pending.  She would like to switch back to Trulicity 6.73 mg weekly. Await A1C results. Stop Ozempic 1 mg weekly, will start Trulicity 4.19 mg weekly.  Urine microalbumin due and pending.  Not on statin therapy. Pneumonia vaccine UTD.  Follow up in 6 months.

## 2022-07-20 ENCOUNTER — Other Ambulatory Visit: Payer: Self-pay | Admitting: Primary Care

## 2022-07-20 DIAGNOSIS — I10 Essential (primary) hypertension: Secondary | ICD-10-CM

## 2022-08-15 ENCOUNTER — Encounter (INDEPENDENT_AMBULATORY_CARE_PROVIDER_SITE_OTHER): Payer: BC Managed Care – PPO

## 2022-08-15 DIAGNOSIS — Z20828 Contact with and (suspected) exposure to other viral communicable diseases: Secondary | ICD-10-CM

## 2022-08-15 MED ORDER — OSELTAMIVIR PHOSPHATE 75 MG PO CAPS: 75.0000 mg | ORAL_CAPSULE | Freq: Every day | ORAL | 0 refills | Status: DC

## 2022-08-15 NOTE — Telephone Encounter (Signed)

## 2022-08-24 ENCOUNTER — Other Ambulatory Visit: Payer: Self-pay | Admitting: Primary Care

## 2022-08-24 DIAGNOSIS — F419 Anxiety disorder, unspecified: Secondary | ICD-10-CM

## 2022-08-28 DIAGNOSIS — G4733 Obstructive sleep apnea (adult) (pediatric): Secondary | ICD-10-CM | POA: Diagnosis not present

## 2022-09-28 DIAGNOSIS — G4733 Obstructive sleep apnea (adult) (pediatric): Secondary | ICD-10-CM | POA: Diagnosis not present

## 2022-12-12 NOTE — Telephone Encounter (Signed)
Scheduled BP f/u 12/20/22

## 2022-12-12 NOTE — Telephone Encounter (Signed)
Please call patient to schedule an office visit for BP check.

## 2022-12-20 ENCOUNTER — Encounter: Payer: Self-pay | Admitting: Primary Care

## 2022-12-20 ENCOUNTER — Ambulatory Visit: Payer: BC Managed Care – PPO | Admitting: Primary Care

## 2022-12-20 VITALS — BP 146/84 | HR 79 | Temp 98.1°F | Ht 62.0 in | Wt 191.0 lb

## 2022-12-20 DIAGNOSIS — E1165 Type 2 diabetes mellitus with hyperglycemia: Secondary | ICD-10-CM | POA: Diagnosis not present

## 2022-12-20 DIAGNOSIS — I10 Essential (primary) hypertension: Secondary | ICD-10-CM | POA: Diagnosis not present

## 2022-12-20 LAB — POCT GLYCOSYLATED HEMOGLOBIN (HGB A1C): Hemoglobin A1C: 6.2 % — AB (ref 4.0–5.6)

## 2022-12-20 MED ORDER — HYDROCHLOROTHIAZIDE 12.5 MG PO TABS: 12.5000 mg | ORAL_TABLET | Freq: Every day | ORAL | 0 refills | Status: DC

## 2022-12-20 MED ORDER — TRULICITY 1.5 MG/0.5ML ~~LOC~~ SOAJ: 1.5000 mg | SUBCUTANEOUS | 1 refills | Status: DC

## 2022-12-20 NOTE — Patient Instructions (Addendum)
We increased the dose of your Trulicity to 1.5 mg weekly for diabetes.  Start hydrochlorothiazide 12.5 mg once daily for blood pressure. Continue olmesartan.  Please schedule a follow up visit to meet back with me in 2-3 weeks for blood pressure check.   It was a pleasure to see you today!

## 2022-12-20 NOTE — Assessment & Plan Note (Signed)
Uncontrolled today, also with home readings.  Continue olmesartan 20 mg daily. Add HCTZ 12.5 mg daily.  We will plan to see her back in 2-3 weeks for BP check and BMP

## 2022-12-20 NOTE — Assessment & Plan Note (Signed)
Improved with A1C today of 6.2!  Increase Trulicity to 1.5 mg weekly for weight loss benefit. She will monitor for lower glucose readings.  Follow up in 6 months.

## 2022-12-20 NOTE — Progress Notes (Signed)
Subjective:    Patient ID: Maria Mcbride, female    DOB: March 27, 1976, 47 y.o.   MRN: [id number]  HPI  Maria Mcbride is a very pleasant 47 y.o. female with a history of hypertension, type 2 diabetes who presents today to discuss hypertension and diabetes.   1) Essential Hypertension: Currently managed on olmesartan 20 mg daily. She contacted [place] last week regarding a several week history of elevated BP readings in the 140's/80's. She was asked to come in for further evaluation.  Previously managed on HCTZ 25 mg but this was discontinued several years ago as she lost some weight. She is checking BP at home which continues to run 140's/80's, also with headaches. She is compliant to her olmesartan 20 mg.   BP Readings from Last 3 Encounters:  12/20/22 (!) 146/84  07/14/22 124/80  01/11/22 112/82    Wt Readings from Last 3 Encounters:  12/20/22 191 lb (86.6 kg)  07/14/22 191 lb (86.6 kg)  01/11/22 193 lb 9.6 oz (87.8 kg)    2) Type 2 Diabetes:  Current medications include: Trulicity 0.75 mg weekly  She is checking her blood glucose 0 times daily.  Last A1C: 6.6 in November 2023 Last Eye Exam: UTD Last Foot Exam: UTD Pneumonia Vaccination: 2022 Urine Microalbumin: UTD Statin:None  Dietary changes since last visit: Previously doing weight watchers. Plans on resuming.   Exercise: None.     Review of Systems  Eyes:  Negative for visual disturbance.  Cardiovascular:  Negative for chest pain.  Neurological:  Positive for dizziness and headaches.         Past Medical History:  Diagnosis Date   Abscess of abdominal cavity    Diabetes mellitus without complication    BORDERLINE   Diverticulitis    Diverticulitis large intestine 01/05/2016   Diverticulitis of colon    Diverticulitis of large intestine without perforation or abscess without bleeding    GERD (gastroesophageal reflux disease)    Hypertension    CONTROLLED ON MEDS   Pneumonia FEB 26,2017   DR SAYS  CLEAR   Sleep apnea    PATIENT THINKS MAY HAVE/ HAS APPT WITH PULMONARY DR AT [facility]    Social History   Socioeconomic History   Marital status: Married    Spouse name: Not on file   Number of children: Not on file   Years of education: Not on file   Highest education level: Not on file  Occupational History   Not on file  Tobacco Use   Smoking status: Never   Smokeless tobacco: Never  Vaping Use   Vaping Use: Never used  Substance and Sexual Activity   Alcohol use: No   Drug use: No   Sexual activity: Yes    Birth control/protection: I.U.D.    Comment: Mirena  Other Topics Concern   Not on file  Social History Narrative   Married.   2 children.    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    Social Determinants of Health   Financial Resource Strain: Not on file  Food Insecurity: Not on file  Transportation Needs: Not on file  Physical Activity: Not on file  Stress: Stress Concern Present (11/27/2019)   [employer] of Occupational Health - Occupational Stress Questionnaire    Feeling of Stress : Rather much  Social Connections: Not on file  Intimate Partner Violence: Not on file    Past Surgical History:  Procedure Laterality Date  CESAREAN SECTION  2007 and 2011   CHOLECYSTECTOMY  2001   Dr. Michela Pitcher   COLON RESECTION N/A 01/05/2016   Procedure: HAND ASSISTED LAPAROSCOPIC COLON RESECTION;  Surgeon: Leafy Ro, MD;  Location: [facility] ORS;  Service: General;  Laterality: N/A;   COLONOSCOPY WITH PROPOFOL N/A 12/16/2015   Procedure: COLONOSCOPY WITH PROPOFOL;  Surgeon: Midge Minium, MD;  Location: [facility];  Service: Endoscopy;  Laterality: N/A;  DIABETIC/ ORAL MED/ IUD   TRANS GLUTEAL DRAIN PLACEMENT     SEDATION    Family History  Problem Relation Age of Onset   Hyperlipidemia Mother    Sleep apnea Mother    Hypertension Mother    Hypothyroidism Sister    Diabetes Maternal Grandmother    Other Maternal Grandmother         cardiovascular disease   Hypertension Maternal Grandmother    Hypertension Paternal Grandmother    Stroke Paternal Grandmother    Colon cancer Paternal Grandfather    Breast cancer Cousin 35    No Known Allergies  Current Outpatient Medications on File Prior to Visit  Medication Sig Dispense Refill   olmesartan (BENICAR) 20 MG tablet Take 1 tablet by mouth once daily for blood pressure 90 tablet 3   olopatadine (PATANOL) 0.1 % ophthalmic solution INSTILL 1 DROP INTO EACH EYE TWICE DAILY 5 mL 0   sertraline (ZOLOFT) 50 MG tablet TAKE 1 TABLET BY MOUTH ONCE DAILY FOR ANXIETY AND FOR DEPRESSION 90 tablet 2   levonorgestrel (MIRENA) 20 MCG/24HR IUD 1 Intra Uterine Device (1 each total) by Intrauterine route once for 1 dose. 1 each 0   oseltamivir (TAMIFLU) 75 MG capsule Take 1 capsule (75 mg total) by mouth daily. (Patient not taking: Reported on 12/20/2022) 7 capsule 0   No current facility-administered medications on file prior to visit.    BP (!) 146/84   Pulse 79   Temp 98.1 F (36.7 C) (Temporal)   Ht 5\' 2"  (1.575 m)   Wt 191 lb (86.6 kg)   SpO2 98%   BMI 34.93 kg/m  Objective:   Physical Exam Cardiovascular:     Rate and Rhythm: Normal rate and regular rhythm.  Pulmonary:     Effort: Pulmonary effort is normal.     Breath sounds: Normal breath sounds.  Musculoskeletal:     Cervical back: Neck supple.  Skin:    General: Skin is warm and dry.           Assessment & Plan:  Essential hypertension Assessment & Plan: Uncontrolled today, also with home readings.  Continue olmesartan 20 mg daily. Add HCTZ 12.5 mg daily.  We will plan to see her back in 2-3 weeks for BP check and BMP  Orders: -     hydroCHLOROthiazide; Take 1 tablet (12.5 mg total) by mouth daily. for blood pressure.  Dispense: 30 tablet; Refill: 0  Type 2 diabetes mellitus with hyperglycemia, without long-term current use of insulin Assessment & Plan: Improved with A1C today of  6.2!  Increase Trulicity to 1.5 mg weekly for weight loss benefit. She will monitor for lower glucose readings.  Follow up in 6 months.  Orders: -     POCT glycosylated hemoglobin (Hb A1C) -     Trulicity; Inject 1.5 mg into the skin once a week. for diabetes.  Dispense: 6 mL; Refill: 1        Doreene Nest, NP

## 2023-01-10 ENCOUNTER — Ambulatory Visit: Payer: BC Managed Care – PPO | Admitting: Primary Care

## 2023-01-10 DIAGNOSIS — I10 Essential (primary) hypertension: Secondary | ICD-10-CM

## 2023-01-10 MED ORDER — HYDROCHLOROTHIAZIDE 12.5 MG PO TABS: 12.5000 mg | ORAL_TABLET | Freq: Every day | ORAL | 1 refills | Status: DC

## 2023-01-10 NOTE — Assessment & Plan Note (Signed)
Improved and at goal!  Continue olmesartan 20 mg daily and HCTZ 12.5 mg daily. BMP pending.  Refills provided.

## 2023-01-10 NOTE — Patient Instructions (Signed)
Stop by the lab prior to leaving today. I will notify you of your results once received.   Please schedule a physical to meet with me in 6 months.   It was a pleasure to see you today!   

## 2023-01-10 NOTE — Progress Notes (Signed)
Subjective:    Patient ID: Maria Mcbride, female    DOB: [birth year], 47 y.o.   MRN: [id number]  HPI  Maria Mcbride is a very pleasant 47 y.o. female with a historyof hypertension, type 2 diabetes, anxiety and depression who presents today for follow up of hypertension.  She was last evaluated on 12/20/22 for elevated BP readings despite management on olmesartan 20 mg daily. Given her elevated readings we added HCTZ 12.5 mg in addition to olmesartan 20 mg. She is here for follow up today.  Since her last visit she's doing well. She denies dizziness, headaches. She is checking BP at home and is getting readings of 110's/80's.   BP Readings from Last 3 Encounters:  01/10/23 118/80  12/20/22 (!) 146/84  07/14/22 124/80    Wt Readings from Last 3 Encounters:  01/10/23 186 lb (84.4 kg)  12/20/22 191 lb (86.6 kg)  07/14/22 191 lb (86.6 kg)       Review of Systems  Respiratory:  Negative for shortness of breath.   Cardiovascular:  Negative for chest pain.  Neurological:  Negative for dizziness and headaches.         Past Medical History:  Diagnosis Date   Abscess of abdominal cavity (HCC)    Diabetes mellitus without complication ([facility])    BORDERLINE   Diverticulitis    Diverticulitis large intestine 01/05/2016   Diverticulitis of colon    Diverticulitis of large intestine without perforation or abscess without bleeding    GERD (gastroesophageal reflux disease)    Hypertension    CONTROLLED ON MEDS   Pneumonia FEB 26,2017   DR SAYS CLEAR   Sleep apnea    PATIENT THINKS MAY HAVE/ HAS APPT WITH PULMONARY DR AT [facility]    Social History   Socioeconomic History   Marital status: Married    Spouse name: Not on file   Number of children: Not on file   Years of education: Not on file   Highest education level: Doctorate  Occupational History   Not on file  Tobacco Use   Smoking status: Never   Smokeless tobacco: Never  Vaping Use   Vaping Use: Never used  Substance  and Sexual Activity   Alcohol use: No   Drug use: No   Sexual activity: Yes    Birth control/protection: I.U.D.    Comment: Mirena  Other Topics Concern   Not on file  Social History Narrative   Married.   2 children.    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    Social Determinants of Health   Financial Resource Strain: Low Risk  (01/10/2023)   Overall Financial Resource Strain (CARDIA)    Difficulty of Paying Living Expenses: Not very hard  Food Insecurity: No Food Insecurity (01/10/2023)   Hunger Vital Sign    Worried About Running Out of Food in the Last Year: Never true    Ran Out of Food in the Last Year: Never true  Transportation Needs: No Transportation Needs (01/10/2023)   PRAPARE - Administrator, Civil Service (Medical): No    Lack of Transportation (Non-Medical): No  Physical Activity: Insufficiently Active (01/10/2023)   Exercise Vital Sign    Days of Exercise per Week: 1 day    Minutes of Exercise per Session: 20 min  Stress: Stress Concern Present (01/10/2023)   [employer] of Occupational Health - Occupational Stress Questionnaire    Feeling of Stress :  To some extent  Social Connections: Unknown (01/10/2023)   Social Connection and Isolation Panel [NHANES]    Frequency of Communication with Friends and Family: More than three times a week    Frequency of Social Gatherings with Friends and Family: Twice a week    Attends Religious Services: Patient declined    Active Member of Clubs or Organizations: No    Attends Banker Meetings: Not on file    Marital Status: Married  Intimate Partner Violence: Not on file    Past Surgical History:  Procedure Laterality Date   CESAREAN SECTION  2007 and 2011   CHOLECYSTECTOMY  2001   Dr. Michela Pitcher   COLON RESECTION N/A 01/05/2016   Procedure: HAND ASSISTED LAPAROSCOPIC COLON RESECTION;  Surgeon: Leafy Ro, MD;  Location: [facility] ORS;  Service: General;  Laterality:  N/A;   COLONOSCOPY WITH PROPOFOL N/A 12/16/2015   Procedure: COLONOSCOPY WITH PROPOFOL;  Surgeon: Midge Minium, MD;  Location: [facility];  Service: Endoscopy;  Laterality: N/A;  DIABETIC/ ORAL MED/ IUD   TRANS GLUTEAL DRAIN PLACEMENT     SEDATION    Family History  Problem Relation Age of Onset   Hyperlipidemia Mother    Sleep apnea Mother    Hypertension Mother    Hypothyroidism Sister    Diabetes Maternal Grandmother    Other Maternal Grandmother        cardiovascular disease   Hypertension Maternal Grandmother    Hypertension Paternal Grandmother    Stroke Paternal Grandmother    Colon cancer Paternal Grandfather    Breast cancer Cousin 35    No Known Allergies  Current Outpatient Medications on File Prior to Visit  Medication Sig Dispense Refill   Dulaglutide (TRULICITY) 1.5 MG/0.5ML SOPN Inject 1.5 mg into the skin once a week. for diabetes. 6 mL 1   olmesartan (BENICAR) 20 MG tablet Take 1 tablet by mouth once daily for blood pressure 90 tablet 3   olopatadine (PATANOL) 0.1 % ophthalmic solution INSTILL 1 DROP INTO EACH EYE TWICE DAILY 5 mL 0   sertraline (ZOLOFT) 50 MG tablet TAKE 1 TABLET BY MOUTH ONCE DAILY FOR ANXIETY AND FOR DEPRESSION 90 tablet 2   levonorgestrel (MIRENA) 20 MCG/24HR IUD 1 Intra Uterine Device (1 each total) by Intrauterine route once for 1 dose. 1 each 0   No current facility-administered medications on file prior to visit.    BP 118/80   Pulse 75   Temp 98.2 F (36.8 C) (Temporal)   Ht 5\' 2"  (1.575 m)   Wt 186 lb (84.4 kg)   SpO2 98%   BMI 34.02 kg/m  Objective:   Physical Exam Cardiovascular:     Rate and Rhythm: Normal rate and regular rhythm.  Pulmonary:     Effort: Pulmonary effort is normal.     Breath sounds: Normal breath sounds.  Musculoskeletal:     Cervical back: Neck supple.  Skin:    General: Skin is warm and dry.           Assessment & Plan:  Essential hypertension Assessment & Plan: Improved and at  goal!  Continue olmesartan 20 mg daily and HCTZ 12.5 mg daily. BMP pending.  Refills provided.   Orders: -     Basic metabolic panel -     hydroCHLOROthiazide; Take 1 tablet (12.5 mg total) by mouth daily. for blood pressure.  Dispense: 90 tablet; Refill: 1        Doreene Nest, NP

## 2023-01-11 LAB — BASIC METABOLIC PANEL
BUN: 10 mg/dL (ref 6–23)
CO2: 31 mEq/L (ref 19–32)
Calcium: 9.5 mg/dL (ref 8.4–10.5)
Chloride: 98 mEq/L (ref 96–112)
Creatinine, Ser: 0.79 mg/dL (ref 0.40–1.20)
GFR: 88.81 mL/min (ref >60.00)
Glucose, Bld: 87 mg/dL (ref 70–99)
Potassium: 3.8 mEq/L (ref 3.5–5.1)
Sodium: 137 mEq/L (ref 135–145)

## 2023-01-12 ENCOUNTER — Ambulatory Visit: Payer: BC Managed Care – PPO | Admitting: Primary Care

## 2023-02-15 DIAGNOSIS — E1165 Type 2 diabetes mellitus with hyperglycemia: Secondary | ICD-10-CM

## 2023-02-15 MED ORDER — TRULICITY 3 MG/0.5ML ~~LOC~~ SOAJ: 3.0000 mg | SUBCUTANEOUS | 0 refills | Status: DC

## 2023-02-19 DIAGNOSIS — L718 Other rosacea: Secondary | ICD-10-CM | POA: Diagnosis not present

## 2023-02-26 DIAGNOSIS — G4733 Obstructive sleep apnea (adult) (pediatric): Secondary | ICD-10-CM | POA: Diagnosis not present

## 2023-03-02 DIAGNOSIS — G4733 Obstructive sleep apnea (adult) (pediatric): Secondary | ICD-10-CM | POA: Diagnosis not present

## 2023-03-28 DIAGNOSIS — G4733 Obstructive sleep apnea (adult) (pediatric): Secondary | ICD-10-CM | POA: Diagnosis not present

## 2023-04-17 DIAGNOSIS — S92522A Displaced fracture of medial phalanx of left lesser toe(s), initial encounter for closed fracture: Secondary | ICD-10-CM | POA: Diagnosis not present

## 2023-04-17 DIAGNOSIS — M79672 Pain in left foot: Secondary | ICD-10-CM | POA: Diagnosis not present

## 2023-04-21 ENCOUNTER — Other Ambulatory Visit: Payer: Self-pay | Admitting: Primary Care

## 2023-04-21 DIAGNOSIS — F419 Anxiety disorder, unspecified: Secondary | ICD-10-CM

## 2023-04-28 DIAGNOSIS — G4733 Obstructive sleep apnea (adult) (pediatric): Secondary | ICD-10-CM | POA: Diagnosis not present

## 2023-04-30 DIAGNOSIS — S92912D Unspecified fracture of left toe(s), subsequent encounter for fracture with routine healing: Secondary | ICD-10-CM | POA: Diagnosis not present

## 2023-04-30 DIAGNOSIS — M79672 Pain in left foot: Secondary | ICD-10-CM | POA: Diagnosis not present

## 2023-05-10 ENCOUNTER — Other Ambulatory Visit: Payer: Self-pay | Admitting: Primary Care

## 2023-05-10 DIAGNOSIS — E1165 Type 2 diabetes mellitus with hyperglycemia: Secondary | ICD-10-CM

## 2023-05-11 ENCOUNTER — Other Ambulatory Visit: Payer: Self-pay | Admitting: Primary Care

## 2023-05-11 DIAGNOSIS — I10 Essential (primary) hypertension: Secondary | ICD-10-CM

## 2023-05-11 NOTE — Telephone Encounter (Signed)
Patient called in regarding this medication.Stated that she's out and would like to know is there any ways that it could be sent in for her today.

## 2023-05-13 ENCOUNTER — Other Ambulatory Visit: Payer: Self-pay | Admitting: Primary Care

## 2023-05-13 DIAGNOSIS — I10 Essential (primary) hypertension: Secondary | ICD-10-CM

## 2023-06-11 ENCOUNTER — Other Ambulatory Visit: Payer: Self-pay | Admitting: Primary Care

## 2023-06-11 DIAGNOSIS — I10 Essential (primary) hypertension: Secondary | ICD-10-CM

## 2023-07-04 ENCOUNTER — Other Ambulatory Visit: Payer: Self-pay | Admitting: Primary Care

## 2023-07-04 DIAGNOSIS — F32A Depression, unspecified: Secondary | ICD-10-CM

## 2023-07-17 ENCOUNTER — Encounter: Payer: BC Managed Care – PPO | Admitting: Primary Care

## 2023-09-14 ENCOUNTER — Other Ambulatory Visit: Payer: Self-pay | Admitting: Primary Care

## 2023-09-14 DIAGNOSIS — I10 Essential (primary) hypertension: Secondary | ICD-10-CM

## 2023-09-14 NOTE — Telephone Encounter (Signed)
 lvm for pt to call office to schedule cpe

## 2023-09-14 NOTE — Telephone Encounter (Signed)
 Patient is due for CPE/follow up, this will be required prior to any further refills.  Please schedule, thank you!

## 2023-10-06 ENCOUNTER — Other Ambulatory Visit: Payer: Self-pay | Admitting: Primary Care

## 2023-10-06 DIAGNOSIS — I10 Essential (primary) hypertension: Secondary | ICD-10-CM

## 2023-10-06 NOTE — Telephone Encounter (Signed)
Patient is due for CPE/follow up, this will be required prior to any further refills.  Please schedule, thank you!

## 2023-10-08 NOTE — Telephone Encounter (Signed)
Patient has been scheduled

## 2023-10-12 ENCOUNTER — Ambulatory Visit (INDEPENDENT_AMBULATORY_CARE_PROVIDER_SITE_OTHER): Payer: BC Managed Care – PPO | Admitting: Primary Care

## 2023-10-12 ENCOUNTER — Encounter: Payer: Self-pay | Admitting: Primary Care

## 2023-10-12 VITALS — BP 122/84 | HR 80 | Temp 98.2°F | Ht 62.0 in | Wt 191.0 lb

## 2023-10-12 DIAGNOSIS — B351 Tinea unguium: Secondary | ICD-10-CM | POA: Insufficient documentation

## 2023-10-12 DIAGNOSIS — Z1231 Encounter for screening mammogram for malignant neoplasm of breast: Secondary | ICD-10-CM

## 2023-10-12 DIAGNOSIS — Z0000 Encounter for general adult medical examination without abnormal findings: Secondary | ICD-10-CM | POA: Diagnosis not present

## 2023-10-12 DIAGNOSIS — Z7985 Long-term (current) use of injectable non-insulin antidiabetic drugs: Secondary | ICD-10-CM

## 2023-10-12 DIAGNOSIS — F419 Anxiety disorder, unspecified: Secondary | ICD-10-CM | POA: Diagnosis not present

## 2023-10-12 DIAGNOSIS — I10 Essential (primary) hypertension: Secondary | ICD-10-CM

## 2023-10-12 DIAGNOSIS — Z0001 Encounter for general adult medical examination with abnormal findings: Secondary | ICD-10-CM

## 2023-10-12 DIAGNOSIS — E1165 Type 2 diabetes mellitus with hyperglycemia: Secondary | ICD-10-CM

## 2023-10-12 DIAGNOSIS — F32A Depression, unspecified: Secondary | ICD-10-CM

## 2023-10-12 HISTORY — DX: Tinea unguium: B35.1

## 2023-10-12 LAB — COMPREHENSIVE METABOLIC PANEL
ALT: 26 U/L (ref 0–35)
AST: 18 U/L (ref 0–37)
Albumin: 4.6 g/dL (ref 3.5–5.2)
Alkaline Phosphatase: 65 U/L (ref 39–117)
BUN: 12 mg/dL (ref 6–23)
CO2: 30 meq/L (ref 19–32)
Calcium: 9.2 mg/dL (ref 8.4–10.5)
Chloride: 98 meq/L (ref 96–112)
Creatinine, Ser: 0.75 mg/dL (ref 0.40–1.20)
GFR: 94.02 mL/min (ref >60.00)
Glucose, Bld: 121 mg/dL — ABNORMAL HIGH (ref 70–99)
Potassium: 3.8 meq/L (ref 3.5–5.1)
Sodium: 137 meq/L (ref 135–145)
Total Bilirubin: 0.6 mg/dL (ref 0.2–1.2)
Total Protein: 7 g/dL (ref 6.0–8.3)

## 2023-10-12 LAB — CBC
HCT: 41 % (ref 36.0–46.0)
Hemoglobin: 13.6 g/dL (ref 12.0–15.0)
MCHC: 33.1 g/dL (ref 30.0–36.0)
MCV: 88.2 fL (ref 78.0–100.0)
Platelets: 237 10*3/uL (ref 150.0–400.0)
RBC: 4.65 Mil/uL (ref 3.87–5.11)
RDW: 14.8 % (ref 11.5–15.5)
WBC: 9 10*3/uL (ref 4.0–10.5)

## 2023-10-12 LAB — LIPID PANEL
Cholesterol: 173 mg/dL (ref 0–200)
HDL: 52.4 mg/dL (ref >39.00)
LDL Cholesterol: 81 mg/dL (ref 0–99)
NonHDL: 120.39
Total CHOL/HDL Ratio: 3
Triglycerides: 199 mg/dL — ABNORMAL HIGH (ref 0.0–149.0)
VLDL: 39.8 mg/dL (ref 0.0–40.0)

## 2023-10-12 LAB — HEMOGLOBIN A1C: Hgb A1c MFr Bld: 9.9 % — ABNORMAL HIGH (ref 4.6–6.5)

## 2023-10-12 LAB — MICROALBUMIN / CREATININE URINE RATIO
Creatinine,U: 171 mg/dL
Microalb Creat Ratio: 0.8 mg/g (ref 0.0–30.0)
Microalb, Ur: 1.3 mg/dL (ref 0.0–1.9)

## 2023-10-12 MED ORDER — TERBINAFINE HCL 250 MG PO TABS: 250.0000 mg | ORAL_TABLET | Freq: Every day | ORAL | 0 refills | Status: DC

## 2023-10-12 NOTE — Patient Instructions (Signed)
Stop by the lab prior to leaving today. I will notify you of your results once received.   [facility].   Start terbinafine to 50 mg once daily for toenail fungus.  Please keep me updated.  Please schedule a follow up visit for 6 months for a diabetes check.  It was a pleasure to see you today!

## 2023-10-12 NOTE — Assessment & Plan Note (Signed)
Evident on exam.  Given no improvement with over-the-counter treatment, will treat with prescription medication. Start terbinafine 250 mg daily from 1 to 3 months.  She will update in about 3 weeks.

## 2023-10-12 NOTE — Progress Notes (Signed)
Subjective:    Patient ID: Maria Mcbride, female    DOB: 07/30/1976, 48 y.o.   MRN: [id number]  HPI  Maria Mcbride is a very pleasant 48 y.o. female who presents today for complete physical and follow up of chronic conditions.  She would also like to discuss onychomycosis.  Chronic to several toes of both feet for years, worse to the left great toe.  She has tried multiple products over-the-counter without improvement.  Immunizations: -Tetanus: Completed in 2017 -Influenza: Completed this season -Pneumonia: Completed Prevnar 20 in 2022  Diet: Fair diet.  Exercise: No regular exercise.  Eye exam: Completes annually  Dental exam: Completes semi-annually    Pap Smear: Follows with GYN, up-to-date Mammogram: October 2021  Colonoscopy: Completed in 2017, due 2027   BP Readings from Last 3 Encounters:  10/12/23 122/84  01/10/23 118/80  12/20/22 (!) 146/84      Review of Systems  Constitutional:  Negative for unexpected weight change.  HENT:  Negative for rhinorrhea.   Respiratory:  Negative for cough and shortness of breath.   Cardiovascular:  Negative for chest pain.  Gastrointestinal:  Negative for constipation and diarrhea.  Genitourinary:  Negative for difficulty urinating.  Musculoskeletal:  Negative for arthralgias and myalgias.  Skin:  Negative for rash.  Allergic/Immunologic: Negative for environmental allergies.  Neurological:  Negative for dizziness, numbness and headaches.         Past Medical History:  Diagnosis Date   Abscess of abdominal cavity (HCC)    Diabetes mellitus without complication (HCC)    BORDERLINE   Diverticulitis    Diverticulitis large intestine 01/05/2016   Diverticulitis of colon    Diverticulitis of large intestine without perforation or abscess without bleeding    GERD (gastroesophageal reflux disease)    Hypertension    CONTROLLED ON MEDS   Pneumonia FEB 26,2017   DR SAYS CLEAR   Sleep apnea    PATIENT THINKS MAY HAVE/  HAS APPT WITH PULMONARY DR AT [facility]    Social History   Socioeconomic History   Marital status: Married    Spouse name: Not on file   Number of children: Not on file   Years of education: Not on file   Highest education level: Doctorate  Occupational History   Not on file  Tobacco Use   Smoking status: Never   Smokeless tobacco: Never  Vaping Use   Vaping status: Never Used  Substance and Sexual Activity   Alcohol use: No   Drug use: No   Sexual activity: Yes    Birth control/protection: I.U.D.    Comment: Mirena  Other Topics Concern   Not on file  Social History Narrative   Married.   2 children.    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    Social Drivers of Corporate investment banker Strain: Low Risk  (01/10/2023)   Overall Financial Resource Strain (CARDIA)    Difficulty of Paying Living Expenses: Not very hard  Food Insecurity: No Food Insecurity (01/10/2023)   Hunger Vital Sign    Worried About Running Out of Food in the Last Year: Never true    Ran Out of Food in the Last Year: Never true  Transportation Needs: No Transportation Needs (01/10/2023)   PRAPARE - Administrator, Civil Service (Medical): No    Lack of Transportation (Non-Medical): No  Physical Activity: Insufficiently Active (01/10/2023)   Exercise Vital Sign  Days of Exercise per Week: 1 day    Minutes of Exercise per Session: 20 min  Stress: Stress Concern Present (01/10/2023)   [employer] of Occupational Health - Occupational Stress Questionnaire    Feeling of Stress : To some extent  Social Connections: Unknown (01/10/2023)   Social Connection and Isolation Panel [NHANES]    Frequency of Communication with Friends and Family: More than three times a week    Frequency of Social Gatherings with Friends and Family: Twice a week    Attends Religious Services: Patient declined    Active Member of Clubs or Organizations: No    Attends Tax inspector Meetings: Not on file    Marital Status: Married  Intimate Partner Violence: Not on file    Past Surgical History:  Procedure Laterality Date   CESAREAN SECTION  2007 and 2011   CHOLECYSTECTOMY  2001   Dr. Michela Pitcher   COLON RESECTION N/A 01/05/2016   Procedure: HAND ASSISTED LAPAROSCOPIC COLON RESECTION;  Surgeon: Leafy Ro, MD;  Location: [facility];  Service: General;  Laterality: N/A;   COLONOSCOPY WITH PROPOFOL N/A 12/16/2015   Procedure: COLONOSCOPY WITH PROPOFOL;  Surgeon: Midge Minium, MD;  [facility];  Service: Endoscopy;  Laterality: N/A;  DIABETIC/ ORAL MED/ IUD   TRANS GLUTEAL DRAIN PLACEMENT     SEDATION    Family History  Problem Relation Age of Onset   Hyperlipidemia Mother    Sleep apnea Mother    Hypertension Mother    Hypothyroidism Sister    Diabetes Maternal Grandmother    Other Maternal Grandmother        cardiovascular disease   Hypertension Maternal Grandmother    Hypertension Paternal Grandmother    Stroke Paternal Grandmother    Colon cancer Paternal Grandfather    Breast cancer Cousin 35    No Known Allergies  Current Outpatient Medications on File Prior to Visit  Medication Sig Dispense Refill   Dulaglutide (TRULICITY) 3 MG/0.5ML SOPN INJECT ONE SYRINGEFUL AS DIRECTED ONCE WEEKLY FOR DIABETES 6 mL 0   hydrochlorothiazide (HYDRODIURIL) 12.5 MG tablet Take 1 tablet by mouth once daily for blood pressure 90 tablet 0   olmesartan (BENICAR) 20 MG tablet Take 1 tablet by mouth once daily for blood pressure 90 tablet 0   olopatadine (PATANOL) 0.1 % ophthalmic solution INSTILL 1 DROP INTO EACH EYE TWICE DAILY 5 mL 0   sertraline (ZOLOFT) 50 MG tablet TAKE 1 TABLET BY MOUTH ONCE DAILY FOR ANXIETY AND FOR DEPRESSION 90 tablet 0   levonorgestrel (MIRENA) 20 MCG/24HR IUD 1 Intra Uterine Device (1 each total) by Intrauterine route once for 1 dose. 1 each 0   No current facility-administered medications on file prior to visit.    BP  122/84   Pulse 80   Temp 98.2 F (36.8 C) (Temporal)   Ht 5\' 2"  (1.575 m)   Wt 191 lb (86.6 kg)   SpO2 98%   BMI 34.93 kg/m  Objective:   Physical Exam HENT:     Right Ear: Tympanic membrane and ear canal normal.     Left Ear: Tympanic membrane and ear canal normal.  Eyes:     Pupils: Pupils are equal, round, and reactive to light.  Cardiovascular:     Rate and Rhythm: Normal rate and regular rhythm.  Pulmonary:     Effort: Pulmonary effort is normal.     Breath sounds: Normal breath sounds.  Abdominal:     General: Bowel sounds  are normal.     Palpations: Abdomen is soft.     Tenderness: There is no abdominal tenderness.  Musculoskeletal:        General: Normal range of motion.     Cervical back: Neck supple.  Skin:    General: Skin is warm and dry.     Comments: Toenail fungus noted to left great toe, left fifth toe, right fifth toe.  Neurological:     Mental Status: She is alert and oriented to person, place, and time.     Cranial Nerves: No cranial nerve deficit.     Deep Tendon Reflexes:     Reflex Scores:      Patellar reflexes are 2+ on the right side and 2+ on the left side. Psychiatric:        Mood and Affect: Mood normal.           Assessment & Plan:  Encounter for annual general medical examination with abnormal findings in adult Assessment & Plan: Immunizations UTD. Pap smear UTD. Follows with GYN Mammogram due, orders placed. Colonoscopy UTD, due 2027  Discussed the importance of a healthy diet and regular exercise in order for weight loss, and to reduce the risk of further co-morbidity.  Exam stable. Labs pending.  Follow up in 1 year for repeat physical.    Essential hypertension Assessment & Plan: Controlled.  Continue hydrochlorothiazide 12.5 mg daily, olmesartan 20 mg daily. CMP pending.   Orders: -     Comprehensive metabolic panel -     Lipid panel -     CBC  Type 2 diabetes mellitus with hyperglycemia, without long-term  current use of insulin (HCC) Assessment & Plan: Repeat A1c pending.  She is working her way back up to the 3 mg dose of Trulicity.  She did discontinue in the fall 2024, titrated up from 0.75, then 1.5.    Foot exam today.  Urine microalbumin due and pending.  Follow-up in 6 months based on A1c result.  Orders: -     Microalbumin / creatinine urine ratio -     Hemoglobin A1c -     Lipid panel  Anxiety and depression Assessment & Plan: Controlled.  Continue sertraline 50 mg daily.   Screening mammogram for breast cancer -     3D Screening Mammogram, Left and Right; Future  Onychomycosis Assessment & Plan: Evident on exam.  Given no improvement with over-the-counter treatment, will treat with prescription medication. Start terbinafine 250 mg daily from 1 to 3 months.  She will update in about 3 weeks.  Orders: -     Terbinafine HCl; Take 1 tablet (250 mg total) by mouth daily.  Dispense: 30 tablet; Refill: 0        Doreene Nest, NP

## 2023-10-12 NOTE — Assessment & Plan Note (Signed)
Controlled.  Continue sertraline 50 mg daily.

## 2023-10-12 NOTE — Assessment & Plan Note (Signed)
Immunizations UTD. Pap smear UTD. Follows with GYN Mammogram due, orders placed. Colonoscopy UTD, due 2027  Discussed the importance of a healthy diet and regular exercise in order for weight loss, and to reduce the risk of further co-morbidity.  Exam stable. Labs pending.  Follow up in 1 year for repeat physical.

## 2023-10-12 NOTE — Assessment & Plan Note (Signed)
Controlled.  Continue hydrochlorothiazide 12.5 mg daily, olmesartan 20 mg daily. CMP pending.

## 2023-10-12 NOTE — Assessment & Plan Note (Signed)
Repeat A1c pending.  She is working her way back up to the 3 mg dose of Trulicity.  She did discontinue in the fall 2024, titrated up from 0.75, then 1.5.    Foot exam today.  Urine microalbumin due and pending.  Follow-up in 6 months based on A1c result.

## 2023-10-23 ENCOUNTER — Other Ambulatory Visit: Payer: Self-pay | Admitting: Primary Care

## 2023-10-23 DIAGNOSIS — F32A Depression, unspecified: Secondary | ICD-10-CM

## 2023-11-07 ENCOUNTER — Ambulatory Visit: Payer: Self-pay | Admitting: Primary Care

## 2023-11-07 ENCOUNTER — Ambulatory Visit
Admission: EM | Admit: 2023-11-07 | Discharge: 2023-11-07 | Disposition: A | Discharge status: Home / Self Care | Payer: BC Managed Care – PPO | Attending: Emergency Medicine | Admitting: Emergency Medicine

## 2023-11-07 ENCOUNTER — Other Ambulatory Visit: Payer: Self-pay

## 2023-11-07 ENCOUNTER — Encounter: Payer: Self-pay | Admitting: Emergency Medicine

## 2023-11-07 DIAGNOSIS — R112 Nausea with vomiting, unspecified: Secondary | ICD-10-CM

## 2023-11-07 DIAGNOSIS — R197 Diarrhea, unspecified: Secondary | ICD-10-CM

## 2023-11-07 LAB — POC COVID19/FLU A&B COMBO
Covid Antigen, POC: NEGATIVE
Influenza A Antigen, POC: NEGATIVE
Influenza B Antigen, POC: NEGATIVE

## 2023-11-07 MED ORDER — ONDANSETRON 4 MG PO TBDP: 4.0000 mg | ORAL_TABLET | Freq: Three times a day (TID) | ORAL | 0 refills | Status: DC | PRN

## 2023-11-07 MED ORDER — ONDANSETRON 4 MG PO TBDP
4.0000 mg | ORAL_TABLET | Freq: Once | ORAL | Status: AC
  Administered 2023-11-07: 4 mg via ORAL

## 2023-11-07 NOTE — ED Provider Notes (Signed)
 Maria Mcbride    CSN: [id number] Arrival date & time: 11/07/23  1315      History   Chief Complaint Chief Complaint  Patient presents with   Emesis    HPI Maria Mcbride is a 48 y.o. female.  Patient presents with headache x 2 days.  She reports body aches, nausea, vomiting, diarrhea since last night.  She took 1 Tylenol this morning.  She has been able to drink fluids without vomiting.  She denies ear pain, sore throat, cough, shortness of breath.  No recent travel out of the country or antibiotic use.  The history is provided by the patient and medical records.    Past Medical History:  Diagnosis Date   Abscess of abdominal cavity (HCC)    Diabetes mellitus without complication (HCC)    BORDERLINE   Diverticulitis    Diverticulitis large intestine 01/05/2016   Diverticulitis of colon    Diverticulitis of large intestine without perforation or abscess without bleeding    GERD (gastroesophageal reflux disease)    Hypertension    CONTROLLED ON MEDS   Pneumonia FEB 26,2017   DR SAYS CLEAR   Sleep apnea    PATIENT THINKS MAY HAVE/ HAS APPT WITH PULMONARY DR AT [facility]    Patient Active Problem List   Diagnosis Date Noted   Onychomycosis 10/12/2023   Anxiety and depression 05/06/2021   Chest wall mass 06/09/2020   Family history of thyroid disease 06/09/2020   Encounter for annual general medical examination with abnormal findings in adult 03/20/2016   Second degree hemorrhoids    Essential hypertension 11/24/2015   Type 2 diabetes mellitus with hyperglycemia (HCC) 11/24/2015   Snoring 11/24/2015    Past Surgical History:  Procedure Laterality Date   CESAREAN SECTION  2007 and 2011   CHOLECYSTECTOMY  2001   Dr. Michela Pitcher   COLON RESECTION N/A 01/05/2016   Procedure: HAND ASSISTED LAPAROSCOPIC COLON RESECTION;  Surgeon: Leafy Ro, MD;  Location: [facility];  Service: General;  Laterality: N/A;   COLONOSCOPY WITH PROPOFOL N/A 12/16/2015   Procedure: COLONOSCOPY WITH  PROPOFOL;  Surgeon: Midge Minium, MD;  Location: [facility];  Service: Endoscopy;  Laterality: N/A;  DIABETIC/ ORAL MED/ IUD   TRANS GLUTEAL DRAIN PLACEMENT     SEDATION    OB History     Gravida  2   Para  2   Term  2   Preterm      AB      Living  2      SAB      IAB      Ectopic      Multiple      Live Births  2            Home Medications    Prior to Admission medications   Medication Sig Start Date End Date Taking? Authorizing Provider  ondansetron (ZOFRAN-ODT) 4 MG disintegrating tablet Take 1 tablet (4 mg total) by mouth every 8 (eight) hours as needed for nausea or vomiting. 11/07/23  Yes Mickie Bail, NP  Dulaglutide (TRULICITY) 3 MG/0.5ML SOPN INJECT ONE SYRINGEFUL AS DIRECTED ONCE WEEKLY FOR DIABETES 05/10/23   Doreene Nest, NP  hydrochlorothiazide (HYDRODIURIL) 12.5 MG tablet Take 1 tablet by mouth once daily for blood pressure 09/14/23   Doreene Nest, NP  levonorgestrel (MIRENA) 20 MCG/24HR IUD 1 Intra Uterine Device (1 each total) by Intrauterine route once for 1 dose. 05/05/19 05/24/20  Copland, Ilona Sorrel,  PA-C  olmesartan (BENICAR) 20 MG tablet Take 1 tablet by mouth once daily for blood pressure 10/06/23   Doreene Nest, NP  olopatadine (PATANOL) 0.1 % ophthalmic solution INSTILL 1 DROP INTO EACH EYE TWICE DAILY 12/19/20   Doreene Nest, NP  sertraline (ZOLOFT) 50 MG tablet TAKE 1 TABLET BY MOUTH ONCE DAILY FOR ANXIETY AND FOR DEPRESSION 10/24/23   Doreene Nest, NP  terbinafine (LAMISIL) 250 MG tablet Take 1 tablet (250 mg total) by mouth daily. 10/12/23   Doreene Nest, NP    Family History Family History  Problem Relation Age of Onset   Hyperlipidemia Mother    Sleep apnea Mother    Hypertension Mother    Hypothyroidism Sister    Diabetes Maternal Grandmother    Other Maternal Grandmother        cardiovascular disease   Hypertension Maternal Grandmother    Hypertension Paternal Grandmother    Stroke  Paternal Grandmother    Colon cancer Paternal Grandfather    Breast cancer Cousin 73    Social History Social History   Tobacco Use   Smoking status: Never   Smokeless tobacco: Never  Vaping Use   Vaping status: Never Used  Substance Use Topics   Alcohol use: No   Drug use: No     Allergies   Patient has no known allergies.   Review of Systems Review of Systems  Constitutional:  Positive for chills and fever.  HENT:  Negative for ear pain and sore throat.   Respiratory:  Negative for cough and shortness of breath.   Gastrointestinal:  Positive for diarrhea, nausea and vomiting. Negative for abdominal pain.  Neurological:  Positive for headaches.     Physical Exam Triage Vital Signs ED Triage Vitals  Encounter Vitals Group     BP 11/07/23 1413 111/72     Systolic BP Percentile --      Diastolic BP Percentile --      Pulse Rate 11/07/23 1413 (!) 127     Resp 11/07/23 1413 20     Temp 11/07/23 1413 (!) 100.4 F (38 C)     Temp Source 11/07/23 1413 Oral     SpO2 11/07/23 1413 94 %     Weight --      Height --      Head Circumference --      Peak Flow --      Pain Score 11/07/23 1410 6     Pain Loc --      Pain Education --      Exclude from Growth Chart --    No data found.  Updated Vital Signs BP 111/72 (BP Location: Right Arm)   Pulse (!) 127   Temp (!) 100.4 F (38 C) (Oral)   Resp 20   SpO2 96%   Visual Acuity Right Eye Distance:   Left Eye Distance:   Bilateral Distance:    Right Eye Near:   Left Eye Near:    Bilateral Near:     Physical Exam Constitutional:      General: She is not in acute distress.    Appearance: She is ill-appearing.  HENT:     Right Ear: Tympanic membrane normal.     Left Ear: Tympanic membrane normal.     Nose: Nose normal.     Mouth/Throat:     Mouth: Mucous membranes are moist.     Pharynx: Oropharynx is clear.  Cardiovascular:     Rate and Rhythm:  Regular rhythm. Tachycardia present.     Heart sounds:  Normal heart sounds.  Pulmonary:     Effort: Pulmonary effort is normal. No respiratory distress.     Breath sounds: Normal breath sounds.  Abdominal:     General: Bowel sounds are normal.     Palpations: Abdomen is soft.     Tenderness: There is no abdominal tenderness. There is no guarding or rebound.  Neurological:     Mental Status: She is alert.      UC Treatments / Results  Labs (all labs ordered are listed, but only abnormal results are displayed) Labs Reviewed  POC COVID19/FLU A&B COMBO    EKG   Radiology No results found.  Procedures Procedures (including critical care time)  Medications Ordered in UC Medications  ondansetron (ZOFRAN-ODT) disintegrating tablet 4 mg (4 mg Oral Given 11/07/23 1419)    Initial Impression / Assessment and Plan / UC Course  I have reviewed the triage vital signs and the nursing notes.  Pertinent labs & imaging results that were available during my care of the patient were reviewed by me and considered in my medical decision making (see chart for details).    Nausea, vomiting, diarrhea.  Rapid flu and COVID-negative.  Zofran given here.  Patient is able to tolerate oral fluids without emesis.  Discharging with prescription for Zofran.  Instructed patient to keep yourself hydrated with clear liquids.  Advance diet as tolerated.  Education provided on nausea and vomiting, diarrhea.  ED precautions given.  Instructed patient to follow-up with her PCP.  She agrees to plan of care.  Final Clinical Impressions(s) / UC Diagnoses   Final diagnoses:  Nausea and vomiting, unspecified vomiting type  Diarrhea, unspecified type     Discharge Instructions      COVID and flu negative.   Take the antinausea medication as directed.    Keep yourself hydrated with clear liquids, such as water.  Follow the diarrhea diet as tolerated.   Go to the emergency department if you have worsening symptoms.    Follow up with your primary care  provider.          ED Prescriptions     Medication Sig Dispense Auth. Provider   ondansetron (ZOFRAN-ODT) 4 MG disintegrating tablet Take 1 tablet (4 mg total) by mouth every 8 (eight) hours as needed for nausea or vomiting. 20 tablet Mickie Bail, NP      PDMP not reviewed this encounter.   Mickie Bail, NP 11/07/23 [phone]

## 2023-11-07 NOTE — Telephone Encounter (Signed)
 Chief Complaint: Extreme nausea Symptoms: Vomiting, cold feet, pain all over body, headache, abdomen pain Frequency: Constant since 10 PM last night Pertinent Negatives: Patient denies N/A Disposition: [x] ED /[] Urgent Care (no appt availability in office) / [] Appointment(In office/virtual)/ []  [facility]/ [] Home Care/ [] Refused Recommended Disposition /[]  Mobile Bus/ []  Follow-up with PCP Additional Notes: Spoke with pt's husband. Pt husband states the headache has been going on for three days. Last night is when the nausea, vomiting, and pain all over body began. Pt husband states pt kept talking about how cold her feet were last night. Pt husband checked pt temperature while on phone and temperature is 99.6 F. Pt had Tylenol an hour ago. Pt husband reports pt has signs of dehydration such as a very dry mouth. Pt husband advised to take pt to ED. Pt husband refused and would like pt to be seen today. This RN called [facility] and spoke with Amy to notify of ED refusal. This RN educated pt husband that PCP office will be notified. Pt husband states understanding. Pt husband requesting pt to be seen today in office for symptoms.   Copied from CRM [id number]. Topic: Clinical - Red Word Triage >> Nov 07, 2023 11:56 AM Theodis Sato wrote: Red Word that prompted transfer to Nurse Triage: Severe headache / naesea and diarrhea the overall body pain and over all very cold. Reason for Disposition  [1] SEVERE vomiting (e.g., 6 or more times/day) AND [2] present > 8 hours (Exception: Patient sounds well, is drinking liquids, does not sound dehydrated, and vomiting has lasted less than 24 hours.)  Answer Assessment - Initial Assessment Questions 1. NAUSEA SEVERITY: "How bad is the nausea?" (e.g., mild, moderate, severe; dehydration, weight loss)   - MILD: loss of appetite without change in eating habits   - MODERATE: decreased oral intake without significant weight loss, dehydration, or  malnutrition   - SEVERE: inadequate caloric or fluid intake, significant weight loss, symptoms of dehydration     Severe 2. 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 VOMITING: "Any vomiting?" If Yes, ask: "How many times today?"    5-6 dry heaving 4. CAUSE: "What do you think is causing the nausea?"     Not sure  Protocols used: Nausea-A-AH, Vomiting-A-AH

## 2023-11-07 NOTE — Telephone Encounter (Signed)
 Per chart review looks like patient is at urgent care now for evaluation

## 2023-11-07 NOTE — Discharge Instructions (Addendum)
 COVID and flu negative.   Take the antinausea medication as directed.    Keep yourself hydrated with clear liquids, such as water.  Follow the diarrhea diet as tolerated.   Go to the emergency department if you have worsening symptoms.    Follow up with your primary care provider.

## 2023-11-07 NOTE — ED Triage Notes (Addendum)
 Nausea, vomiting, headaches, and body aches.  Temp 99.6 Patient had tylenol around 10-10:30 am today.    Reports 5-6 episodes of vomiting during the night.  One vomiting episode this morning.    Has had 2 episodes of diarrhea today  Headache for 2 days, remaining symptoms started last night after evening meal

## 2023-11-08 ENCOUNTER — Ambulatory Visit: Payer: Self-pay

## 2023-11-08 NOTE — Telephone Encounter (Signed)
 Noted.

## 2023-11-11 ENCOUNTER — Other Ambulatory Visit: Payer: Self-pay | Admitting: Primary Care

## 2023-11-11 DIAGNOSIS — B351 Tinea unguium: Secondary | ICD-10-CM

## 2023-11-13 DIAGNOSIS — B351 Tinea unguium: Secondary | ICD-10-CM

## 2023-11-13 MED ORDER — TERBINAFINE HCL 250 MG PO TABS
250.0000 mg | ORAL_TABLET | Freq: Every day | ORAL | 0 refills | Status: DC
Start: 2023-11-13 — End: 2023-12-14

## 2023-12-06 DIAGNOSIS — B351 Tinea unguium: Secondary | ICD-10-CM

## 2023-12-06 DIAGNOSIS — E1165 Type 2 diabetes mellitus with hyperglycemia: Secondary | ICD-10-CM

## 2023-12-07 ENCOUNTER — Ambulatory Visit
Admission: RE | Admit: 2023-12-07 | Discharge: 2023-12-07 | Disposition: A | Discharge status: Home / Self Care | Source: Ambulatory Visit | Attending: Primary Care | Admitting: Primary Care

## 2023-12-07 DIAGNOSIS — Z1231 Encounter for screening mammogram for malignant neoplasm of breast: Secondary | ICD-10-CM | POA: Insufficient documentation

## 2023-12-07 MED ORDER — RYBELSUS 3 MG PO TABS: 3.0000 mg | ORAL_TABLET | Freq: Every day | ORAL | 0 refills | Status: DC

## 2023-12-14 ENCOUNTER — Other Ambulatory Visit: Payer: Self-pay | Admitting: Primary Care

## 2023-12-14 DIAGNOSIS — I10 Essential (primary) hypertension: Secondary | ICD-10-CM

## 2023-12-14 MED ORDER — TERBINAFINE HCL 250 MG PO TABS: 250.0000 mg | ORAL_TABLET | Freq: Every day | ORAL | 0 refills | Status: DC

## 2023-12-19 LAB — HM DIABETES EYE EXAM

## 2023-12-22 ENCOUNTER — Other Ambulatory Visit: Payer: Self-pay | Admitting: Primary Care

## 2023-12-22 DIAGNOSIS — E1165 Type 2 diabetes mellitus with hyperglycemia: Secondary | ICD-10-CM

## 2024-01-06 ENCOUNTER — Other Ambulatory Visit: Payer: Self-pay | Admitting: Primary Care

## 2024-01-06 DIAGNOSIS — E1165 Type 2 diabetes mellitus with hyperglycemia: Secondary | ICD-10-CM

## 2024-01-06 NOTE — Telephone Encounter (Signed)
 Patient is due for diabetes follow up in late July, this will be required prior to any further refills.  Please schedule, thank you!

## 2024-01-07 NOTE — Telephone Encounter (Signed)
 Lvm to schedule for apt in late July to continue refills

## 2024-01-09 ENCOUNTER — Other Ambulatory Visit: Payer: Self-pay | Admitting: Primary Care

## 2024-01-09 DIAGNOSIS — I10 Essential (primary) hypertension: Secondary | ICD-10-CM

## 2024-01-21 ENCOUNTER — Encounter: Payer: Self-pay | Admitting: Primary Care

## 2024-01-21 ENCOUNTER — Ambulatory Visit: Admitting: Primary Care

## 2024-01-21 VITALS — BP 100/60 | HR 72 | Temp 98.1°F | Ht 62.0 in | Wt 191.0 lb

## 2024-01-21 DIAGNOSIS — Z7984 Long term (current) use of oral hypoglycemic drugs: Secondary | ICD-10-CM | POA: Diagnosis not present

## 2024-01-21 DIAGNOSIS — E1165 Type 2 diabetes mellitus with hyperglycemia: Secondary | ICD-10-CM

## 2024-01-21 LAB — POCT GLYCOSYLATED HEMOGLOBIN (HGB A1C): Hemoglobin A1C: 7 % — AB (ref 4.0–5.6)

## 2024-01-21 NOTE — Patient Instructions (Signed)
 Increase your Rybelsus  to 7 mg once daily once you complete 3 mg dose.   Please schedule a follow up visit for 6 months for a diabetes check.  It was a pleasure to see you today!

## 2024-01-21 NOTE — Progress Notes (Signed)
 Subjective:    Patient ID: Maria Mcbride, female    DOB: 17-Jan-1976, 48 y.o.   MRN: [id number]  HPI  Maria Mcbride is a very pleasant 48 y.o. female with a history of type 2 diabetes, hypertension reviewed and later in the game 2 oh who presents today for follow-up of diabetes.  Current medications include: Rybelsus  7 mg daily. She has not started the 7 mg dose of Rybelsus  but plans on starting soon when her 3 mg dose runs out.   She has noticed diarrhea and gas with Rybelsus  but symptoms are tolerable.   She is checking her blood glucose 1 times daily and is getting readings of:  AM fasting: 160s  Last A1C: 9.9 in January 2025, 7.0 today  Last Eye Exam: UTD Last Foot Exam: UTD Pneumonia Vaccination: 2022 Urine Microalbumin: UTD Statin: None  Dietary changes since last visit: Cutting back on carbs   Exercise: Walking  Wt Readings from Last 3 Encounters:  01/21/24 191 lb (86.6 kg)  10/12/23 191 lb (86.6 kg)  01/10/23 186 lb (84.4 kg)       Review of Systems  Eyes:  Negative for visual disturbance.  Respiratory:  Negative for shortness of breath.   Cardiovascular:  Negative for chest pain.  Endocrine: Negative for polydipsia, polyphagia and polyuria.  Neurological:  Negative for numbness.         Past Medical History:  Diagnosis Date   Abscess of abdominal cavity (HCC)    Diabetes mellitus without complication (HCC)    BORDERLINE   Diverticulitis    Diverticulitis large intestine 01/05/2016   Diverticulitis of colon    Diverticulitis of large intestine without perforation or abscess without bleeding    GERD (gastroesophageal reflux disease)    Hypertension    CONTROLLED ON MEDS   Pneumonia FEB 26,2017   DR SAYS CLEAR   Sleep apnea    PATIENT THINKS MAY HAVE/ HAS APPT WITH PULMONARY DR AT [facility]    Social History   Socioeconomic History   Marital status: Married    Spouse name: Not on file   Number of children: Not on file   Years of education: Not  on file   Highest education level: Doctorate  Occupational History   Not on file  Tobacco Use   Smoking status: Never   Smokeless tobacco: Never  Vaping Use   Vaping status: Never Used  Substance and Sexual Activity   Alcohol use: No   Drug use: No   Sexual activity: Yes    Birth control/protection: I.U.D.    Comment: Mirena   Other Topics Concern   Not on file  Social History Narrative   Married.   2 children.    Works as a Teacher, early years/pre.   Enjoys camping, being out doors, water  skiiing.    Social Drivers of Corporate investment banker Strain: Low Risk  (01/10/2023)   Overall Financial Resource Strain (CARDIA)    Difficulty of Paying Living Expenses: Not very hard  Food Insecurity: No Food Insecurity (01/10/2023)   Hunger Vital Sign    Worried About Running Out of Food in the Last Year: Never true    Ran Out of Food in the Last Year: Never true  Transportation Needs: No Transportation Needs (01/10/2023)   PRAPARE - Administrator, Civil Service (Medical): No    Lack of Transportation (Non-Medical): No  Physical Activity: Insufficiently Active (01/10/2023)   Exercise Vital Sign    Days of Exercise  per Week: 1 day    Minutes of Exercise per Session: 20 min  Stress: Stress Concern Present (01/10/2023)   [employer] of Occupational Health - Occupational Stress Questionnaire    Feeling of Stress : To some extent  Social Connections: Unknown (01/10/2023)   Social Connection and Isolation Panel [NHANES]    Frequency of Communication with Friends and Family: More than three times a week    Frequency of Social Gatherings with Friends and Family: Twice a week    Attends Religious Services: Patient declined    Active Member of Clubs or Organizations: No    Attends Banker Meetings: Not on file    Marital Status: Married  Intimate Partner Violence: Not on file    Past Surgical History:  Procedure Laterality Date   CESAREAN SECTION  2007 and 2011    CHOLECYSTECTOMY  2001   Dr. Amalia Badder   COLON RESECTION N/A 01/05/2016   Procedure: HAND ASSISTED LAPAROSCOPIC COLON RESECTION;  Surgeon: Alben Alma, MD;  Location: [facility] ORS;  Service: General;  Laterality: N/A;   COLONOSCOPY WITH PROPOFOL  N/A 12/16/2015   Procedure: COLONOSCOPY WITH PROPOFOL ;  Surgeon: Marnee Sink, MD;  [facility];  Service: Endoscopy;  Laterality: N/A;  DIABETIC/ ORAL MED/ IUD   TRANS GLUTEAL DRAIN PLACEMENT     SEDATION    Family History  Problem Relation Age of Onset   Hyperlipidemia Mother    Sleep apnea Mother    Hypertension Mother    Hypothyroidism Sister    Diabetes Maternal Grandmother    Other Maternal Grandmother        cardiovascular disease   Hypertension Maternal Grandmother    Hypertension Paternal Grandmother    Stroke Paternal Grandmother    Colon cancer Paternal Grandfather    Breast cancer Cousin 35    No Known Allergies  Current Outpatient Medications on File Prior to Visit  Medication Sig Dispense Refill   hydrochlorothiazide  (HYDRODIURIL ) 12.5 MG tablet Take 1 tablet by mouth once daily for blood pressure 90 tablet 1   olmesartan  (BENICAR ) 20 MG tablet Take 1 tablet by mouth once daily for blood pressure 90 tablet 1   olopatadine  (PATANOL) 0.1 % ophthalmic solution INSTILL 1 DROP INTO EACH EYE TWICE DAILY 5 mL 0   ondansetron  (ZOFRAN -ODT) 4 MG disintegrating tablet Take 1 tablet (4 mg total) by mouth every 8 (eight) hours as needed for nausea or vomiting. 20 tablet 0   Semaglutide  (RYBELSUS ) 7 MG TABS Take 1 tablet (7 mg total) by mouth daily. for diabetes. 90 tablet 0   sertraline  (ZOLOFT ) 50 MG tablet TAKE 1 TABLET BY MOUTH ONCE DAILY FOR ANXIETY AND FOR DEPRESSION 90 tablet 3   terbinafine  (LAMISIL ) 250 MG tablet Take 1 tablet (250 mg total) by mouth daily. 30 tablet 0   levonorgestrel  (MIRENA ) 20 MCG/24HR IUD 1 Intra Uterine Device (1 each total) by Intrauterine route once for 1 dose. 1 each 0   No current  facility-administered medications on file prior to visit.    BP 100/60   Pulse 72   Temp 98.1 F (36.7 C) (Oral)   Ht 5\' 2"  (1.575 m)   Wt 191 lb (86.6 kg)   SpO2 91%   BMI 34.93 kg/m  Objective:   Physical Exam Cardiovascular:     Rate and Rhythm: Normal rate and regular rhythm.  Pulmonary:     Effort: Pulmonary effort is normal.     Breath sounds: Normal breath sounds.  Musculoskeletal:  Cervical back: Neck supple.  Skin:    General: Skin is warm and dry.  Neurological:     Mental Status: She is alert and oriented to person, place, and time.  Psychiatric:        Mood and Affect: Mood normal.           Assessment & Plan:  Type 2 diabetes mellitus with hyperglycemia, without long-term current use of insulin  (HCC) Assessment & Plan: Improved with A1c of 7.0 today!  Continue Rybelsus  3 mg daily until complete, then increase to 7 mg daily thereafter.  Follow up in 6 months.   Orders: -     POCT glycosylated hemoglobin (Hb A1C)        Gabriel John, NP

## 2024-01-21 NOTE — Assessment & Plan Note (Signed)
 Improved with A1c of 7.0 today!  Continue Rybelsus  3 mg daily until complete, then increase to 7 mg daily thereafter.  Follow up in 6 months.

## 2024-04-10 ENCOUNTER — Other Ambulatory Visit: Payer: Self-pay | Admitting: Primary Care

## 2024-04-10 DIAGNOSIS — E1165 Type 2 diabetes mellitus with hyperglycemia: Secondary | ICD-10-CM

## 2024-05-15 ENCOUNTER — Other Ambulatory Visit: Payer: Self-pay | Admitting: Primary Care

## 2024-05-15 DIAGNOSIS — I10 Essential (primary) hypertension: Secondary | ICD-10-CM

## 2024-05-16 ENCOUNTER — Other Ambulatory Visit: Payer: Self-pay | Admitting: Primary Care

## 2024-05-16 DIAGNOSIS — I10 Essential (primary) hypertension: Secondary | ICD-10-CM

## 2024-06-14 ENCOUNTER — Other Ambulatory Visit: Payer: Self-pay | Admitting: Primary Care

## 2024-06-14 DIAGNOSIS — I10 Essential (primary) hypertension: Secondary | ICD-10-CM

## 2024-07-23 ENCOUNTER — Ambulatory Visit: Admitting: Primary Care

## 2024-09-08 ENCOUNTER — Telehealth: Payer: Self-pay

## 2024-09-08 NOTE — Telephone Encounter (Signed)
 Copied from CRM [id number]. Topic: Clinical - Order For Equipment >> Sep 08, 2024 12:18 PM Devaughn RAMAN wrote: Reason for CRM: Pt called in requesting a new CPAP machine and she stated [facility]  needs a prescription with a setting on the prescription, usage/benefits statement and clinical notes from her last office visit. Pt was last seen 09/01/2019.  [facility] [phone]

## 2024-09-09 ENCOUNTER — Other Ambulatory Visit: Payer: Self-pay | Admitting: Primary Care

## 2024-09-09 DIAGNOSIS — I10 Essential (primary) hypertension: Secondary | ICD-10-CM

## 2024-09-09 DIAGNOSIS — F419 Anxiety disorder, unspecified: Secondary | ICD-10-CM

## 2024-09-10 NOTE — Telephone Encounter (Signed)
 Patient is due for CPE/follow up in early February, this will be required prior to any further refills.  Please schedule, thank you!

## 2024-09-16 ENCOUNTER — Other Ambulatory Visit: Payer: Self-pay | Admitting: Primary Care

## 2024-09-16 DIAGNOSIS — I10 Essential (primary) hypertension: Secondary | ICD-10-CM

## 2024-09-16 DIAGNOSIS — F32A Depression, unspecified: Secondary | ICD-10-CM

## 2024-09-16 NOTE — Telephone Encounter (Signed)
 Patient is due for CPE/follow up in early February, this will be required prior to any further refills.  Please schedule, thank you!

## 2024-09-17 ENCOUNTER — Ambulatory Visit: Admitting: Sleep Medicine

## 2024-09-17 ENCOUNTER — Encounter: Payer: Self-pay | Admitting: Sleep Medicine

## 2024-09-17 VITALS — BP 124/70 | HR 79 | Ht 62.0 in | Wt 184.6 lb

## 2024-09-17 DIAGNOSIS — Z6833 Body mass index (BMI) 33.0-33.9, adult: Secondary | ICD-10-CM | POA: Diagnosis not present

## 2024-09-17 DIAGNOSIS — E669 Obesity, unspecified: Secondary | ICD-10-CM

## 2024-09-17 DIAGNOSIS — I10 Essential (primary) hypertension: Secondary | ICD-10-CM | POA: Diagnosis not present

## 2024-09-17 DIAGNOSIS — G4733 Obstructive sleep apnea (adult) (pediatric): Secondary | ICD-10-CM

## 2024-09-17 NOTE — Patient Instructions (Addendum)
 Maria Mcbride

## 2024-09-17 NOTE — Progress Notes (Signed)
 "      Name:Maria Mcbride MRN: [id number] DOB: Feb 04, 1976   CHIEF COMPLAINT:  REASSESSMENT OF OSA   HISTORY OF PRESENT ILLNESS: Maria Mcbride is a 49 y.o. w/ a h/o OSA, HTN, DMII, anxiety, depression and obesity who presents for reassessment of OSA. Reports that she was initially diagnosed with OSA in 2017 and was subsequently started on CPAP therapy. Reports using CPAP therapy every night, which is confirmed by compliance data. Reports that her CPAP device started displaying a motor life being exceeded message around 3 months ago. She is currently using the Airfit P10 nasal pillow mask. Denies any nocturnal awakenings. Denies any significant weight changes. Admits to dry mouth. Denies morning headaches, RLS symptoms, dream enactment, cataplexy, hypnagogic or hypnapompic hallucinations. Reports a family history of sleep apnea. Denies drowsy driving. Drinks 1 cup of coffee and 1 glass of tea daily, denies alcohol, tobacco or illicit drug use.   Bedtime 11:30 pm Sleep onset 5 mins Rise time 6 am    PAST MEDICAL HISTORY :   has a past medical history of Abscess of abdominal cavity (HCC), Diabetes mellitus without complication (HCC), Diverticulitis, Diverticulitis large intestine (01/05/2016), Diverticulitis of colon, Diverticulitis of large intestine without perforation or abscess without bleeding, GERD (gastroesophageal reflux disease), Hypertension, Pneumonia (FEB 26,2017), and Sleep apnea.  has a past surgical history that includes Cesarean section (2007 and 2011); Cholecystectomy (2001); TRANS GLUTEAL DRAIN PLACEMENT; Colonoscopy with propofol  (N/A, 12/16/2015); and Colon resection (N/A, 01/05/2016). Prior to Admission medications  Medication Sig Start Date End Date Taking? Authorizing Provider  hydrochlorothiazide  (HYDRODIURIL ) 12.5 MG tablet Take 1 tablet by mouth once daily for blood pressure 05/16/24  Yes Clark, Katherine K, NP  olmesartan  (BENICAR ) 20 MG tablet Take 1 tablet by mouth once daily  for blood pressure 09/10/24  Yes Clark, Katherine K, NP  olopatadine  (PATANOL) 0.1 % ophthalmic solution INSTILL 1 DROP INTO EACH EYE TWICE DAILY 12/19/20  Yes Clark, Katherine K, NP  ondansetron  (ZOFRAN -ODT) 4 MG disintegrating tablet Take 1 tablet (4 mg total) by mouth every 8 (eight) hours as needed for nausea or vomiting. 11/07/23  Yes Corlis Burnard DEL, NP  Semaglutide  (RYBELSUS ) 7 MG TABS TAKE 1 TABLET BY MOUTH ONCE DAILY FOR DIABETES 04/10/24  Yes Clark, Katherine K, NP  sertraline  (ZOLOFT ) 50 MG tablet TAKE 1 TABLET BY MOUTH ONCE DAILY FOR ANXIETY AND FOR DEPRESSION 09/16/24  Yes Gretta Comer POUR, NP  terbinafine  (LAMISIL ) 250 MG tablet Take 1 tablet (250 mg total) by mouth daily. 12/14/23  Yes Gretta Comer POUR, NP  levonorgestrel  (MIRENA ) 20 MCG/24HR IUD 1 Intra Uterine Device (1 each total) by Intrauterine route once for 1 dose. 05/05/19 05/24/20  Copland, Bernarda NOVAK, PA-C   Allergies[1]  FAMILY HISTORY:  family history includes Breast cancer (age of onset: 68) in her cousin; Colon cancer in her paternal grandfather; Diabetes in her maternal grandmother; Hyperlipidemia in her mother; Hypertension in her maternal grandmother, mother, and paternal grandmother; Hypothyroidism in her sister; Other in her maternal grandmother; Sleep apnea in her mother; Stroke in her paternal grandmother. SOCIAL HISTORY:  reports that she has never smoked. She has never used smokeless tobacco. She reports that she does not drink alcohol and does not use drugs.   Review of Systems:  Gen:  Denies  fever, sweats, chills weight loss  HEENT: Denies blurred vision, double vision, ear pain, eye pain, hearing loss, nose bleeds, sore throat Cardiac:  No dizziness, chest pain or heaviness, chest tightness,edema, No JVD Resp:  No cough, -sputum production, -shortness of breath,-wheezing, -hemoptysis,  Gi: Denies swallowing difficulty, stomach pain, nausea or vomiting, diarrhea, constipation, bowel incontinence Gu:  Denies  bladder incontinence, burning urine Ext:   Denies Joint pain, stiffness or swelling Skin: Denies  skin rash, easy bruising or bleeding or hives Endoc:  Denies polyuria, polydipsia , polyphagia or weight change Psych:   Denies depression, insomnia or hallucinations  Other:  All other systems negative  VITAL SIGNS: BP 124/70   Pulse 79   Ht 5' 2 (1.575 m)   Wt 184 lb 9.6 oz (83.7 kg)   LMP  (LMP Unknown)   SpO2 98%   BMI 33.76 kg/m    Physical Examination:   General Appearance: No distress  EYES PERRLA, EOM intact.   NECK Supple, No JVD Pulmonary: normal breath sounds, No wheezing.  CardiovascularNormal S1,S2.  No m/r/g.   Abdomen: Benign, Soft, non-tender. Skin:   warm, no rashes, no ecchymosis  Extremities: normal, no cyanosis, clubbing. Neuro:without focal findings,  speech normal  PSYCHIATRIC: Mood, affect within normal limits.   ASSESSMENT AND PLAN  OSA Due to patient's CPAP device displaying a motor life message, will complete reassessment of OSA with HST and then complete APAP order set to 4-16 cm H2O. Discussed the consequences of untreated sleep apnea. Advised not to drive drowsy for safety of patient and others. Will complete further evaluation with a home sleep study and follow up for CPAP visit in 3 months.  HTN Stable, on current management. Following with PCP.   Obesity Counseled patient on diet and lifestyle modification.    MEDICATION ADJUSTMENTS/LABS AND TESTS ORDERED: Recommend Sleep Study   Patient  satisfied with Plan of action and management. All questions answered  Follow up to review HST results and treatment plan.   I spent a total of 48 minutes reviewing chart data, face-to-face evaluation with the patient, counseling and coordination of care as detailed above.    Gaius Ishaq, M.D.  Sleep Medicine [facility]           [1] No Known Allergies  "

## 2024-10-02 NOTE — Addendum Note (Signed)
 Addended by: Lamis Behrmann on: 10/02/2024 12:33 PM   Modules accepted: Level of Service

## 2024-10-05 ENCOUNTER — Other Ambulatory Visit: Payer: Self-pay | Admitting: Primary Care

## 2024-10-05 DIAGNOSIS — E1165 Type 2 diabetes mellitus with hyperglycemia: Secondary | ICD-10-CM

## 2024-10-05 NOTE — Telephone Encounter (Signed)
 Patient is due for CPE/follow up in early February, this will be required prior to any further refills.  Please schedule, thank you!

## 2024-10-14 NOTE — Telephone Encounter (Signed)
 Copied from CRM [id number]. Topic: Clinical - Request for Lab/Test Order >> Oct 13, 2024  3:38 PM Maria Mcbride ORN wrote: Reason for CRM: pt scheduled physical for 2/4 and is need of lab order to schedule.  Do you want patient to labs before?

## 2024-10-14 NOTE — Telephone Encounter (Signed)
 We can get labs during visit.

## 2024-10-15 ENCOUNTER — Ambulatory Visit: Payer: Self-pay | Admitting: Primary Care

## 2024-10-15 ENCOUNTER — Encounter: Payer: Self-pay | Admitting: Primary Care

## 2024-10-15 ENCOUNTER — Ambulatory Visit: Admitting: Primary Care

## 2024-10-15 VITALS — BP 118/64 | HR 80 | Temp 97.9°F | Ht 62.0 in | Wt 187.0 lb

## 2024-10-15 DIAGNOSIS — F419 Anxiety disorder, unspecified: Secondary | ICD-10-CM

## 2024-10-15 DIAGNOSIS — Z1231 Encounter for screening mammogram for malignant neoplasm of breast: Secondary | ICD-10-CM

## 2024-10-15 DIAGNOSIS — Z23 Encounter for immunization: Secondary | ICD-10-CM

## 2024-10-15 DIAGNOSIS — E1165 Type 2 diabetes mellitus with hyperglycemia: Secondary | ICD-10-CM

## 2024-10-15 DIAGNOSIS — I10 Essential (primary) hypertension: Secondary | ICD-10-CM

## 2024-10-15 DIAGNOSIS — Z0000 Encounter for general adult medical examination without abnormal findings: Secondary | ICD-10-CM

## 2024-10-15 LAB — LIPID PANEL
Cholesterol: 170 mg/dL (ref 28–200)
HDL: 57.9 mg/dL
LDL Cholesterol: 79 mg/dL (ref 10–99)
NonHDL: 112.42
Total CHOL/HDL Ratio: 3
Triglycerides: 169 mg/dL — ABNORMAL HIGH (ref 10.0–149.0)
VLDL: 33.8 mg/dL (ref 0.0–40.0)

## 2024-10-15 LAB — COMPREHENSIVE METABOLIC PANEL WITH GFR
ALT: 23 U/L (ref 3–35)
AST: 16 U/L (ref 5–37)
Albumin: 4.6 g/dL (ref 3.5–5.2)
Alkaline Phosphatase: 64 U/L (ref 39–117)
BUN: 10 mg/dL (ref 6–23)
CO2: 30 meq/L (ref 19–32)
Calcium: 9.9 mg/dL (ref 8.4–10.5)
Chloride: 99 meq/L (ref 96–112)
Creatinine, Ser: 0.71 mg/dL (ref 0.40–1.20)
GFR: 99.7 mL/min
Glucose, Bld: 103 mg/dL — ABNORMAL HIGH (ref 70–99)
Potassium: 4 meq/L (ref 3.5–5.1)
Sodium: 138 meq/L (ref 135–145)
Total Bilirubin: 0.6 mg/dL (ref 0.2–1.2)
Total Protein: 7.1 g/dL (ref 6.0–8.3)

## 2024-10-15 LAB — MICROALBUMIN / CREATININE URINE RATIO
Creatinine,U: 106.3 mg/dL
Microalb Creat Ratio: UNDETERMINED mg/g (ref 0.0–30.0)
Microalb, Ur: <0.7 mg/dL

## 2024-10-15 LAB — HEMOGLOBIN A1C: Hgb A1c MFr Bld: 6.7 % — ABNORMAL HIGH (ref 4.6–6.5)

## 2024-10-15 MED ORDER — MOUNJARO 2.5 MG/0.5ML ~~LOC~~ SOAJ: 2.5000 mg | SUBCUTANEOUS | 0 refills | Status: AC

## 2024-10-15 NOTE — Assessment & Plan Note (Signed)
 Rybelsus  has become cost prohibitive.   Start tirzepitide (Mounjaro ) for diabetes/weight loss. Start by injecting 2.5 mg into the skin once weekly for 4 weeks, then increase to 5 mg once weekly thereafter.   Repeat A1C pending.  Urine microalbumin pending  Follow-up in 3 to 6 months based on A1c result.

## 2024-10-15 NOTE — Assessment & Plan Note (Signed)
Stable.  Continue sertraline 50 mg daily.

## 2024-10-15 NOTE — Patient Instructions (Signed)
 Stop by the lab prior to leaving today. I will notify you of your results once received.   Start tirzepitide (Mounjaro ) for diabetes/weight loss. Start by injecting 2.5 mg into the skin once weekly for 4 weeks, then increase to 5 mg once weekly thereafter. Please notify me once you've used your last 2.5 mg pen so that I can prescribe the next dose.   Please schedule a follow up visit for 6 months for a diabetes check.  It was a pleasure to see you today!

## 2024-10-15 NOTE — Addendum Note (Signed)
 Addended by: JACKOLYN PLANAS on: 10/15/2024 03:38 PM   Modules accepted: Orders

## 2024-10-15 NOTE — Progress Notes (Signed)
 "  Subjective:    Patient ID: Maria Mcbride, female    DOB: [birth year], 49 y.o.   MRN: [id number]  Maria Mcbride is a very pleasant 48 y.o. female who presents today for complete physical and follow up of chronic conditions.  Rybelsus  has become cost prohibitive. She would like to transition Mounjoro.   Immunizations: -Tetanus: Completed in 2017 -Influenza: Completed this season  -Pneumonia: Completed 2022  Diet: Fair diet.  Exercise: No regular exercise.  Eye exam: Completes annually  Dental exam: Completes semi-annually    Pap Smear: Completes per GYN. Mammogram: Completed in April 2025  Colonoscopy: Completed in 2017, due 2027  BP Readings from Last 3 Encounters:  10/15/24 118/64  09/17/24 124/70  01/21/24 100/60   Wt Readings from Last 3 Encounters:  10/15/24 187 lb (84.8 kg)  09/17/24 184 lb 9.6 oz (83.7 kg)  01/21/24 191 lb (86.6 kg)      Review of Systems  Constitutional:  Negative for unexpected weight change.  HENT:  Negative for rhinorrhea.   Respiratory:  Negative for cough and shortness of breath.   Cardiovascular:  Negative for chest pain.  Gastrointestinal:  Negative for constipation and diarrhea.  Genitourinary:  Negative for difficulty urinating.  Musculoskeletal:  Negative for arthralgias and myalgias.  Skin:  Negative for rash.  Allergic/Immunologic: Negative for environmental allergies.  Neurological:  Negative for dizziness, numbness and headaches.  Psychiatric/Behavioral:  The patient is not nervous/anxious.          Past Medical History:  Diagnosis Date   Abscess of abdominal cavity (HCC)    Diabetes mellitus without complication (HCC)    BORDERLINE   Diverticulitis    Diverticulitis large intestine 01/05/2016   Diverticulitis of colon    Diverticulitis of large intestine without perforation or abscess without bleeding    GERD (gastroesophageal reflux disease)    Hypertension    CONTROLLED ON MEDS   Onychomycosis 10/12/2023    Pneumonia 11/07/2015   DR SAYS CLEAR   Sleep apnea    PATIENT THINKS MAY HAVE/ HAS APPT WITH PULMONARY DR AT [facility]    Social History   Socioeconomic History   Marital status: Married    Spouse name: Not on file   Number of children: Not on file   Years of education: Not on file   Highest education level: Doctorate  Occupational History   Not on file  Tobacco Use   Smoking status: Never   Smokeless tobacco: Never  Vaping Use   Vaping status: Never Used  Substance and Sexual Activity   Alcohol use: No   Drug use: No   Sexual activity: Yes    Birth control/protection: I.U.D.    Comment: Mirena   Other Topics Concern   Not on file  Social History Narrative   Married.   2 children.    Works as a teacher, early years/pre.   Enjoys camping, being out doors, water  skiiing.    Social Drivers of Health   Tobacco Use: Low Risk (09/17/2024)   Patient History    Smoking Tobacco Use: Never    Smokeless Tobacco Use: Never    Passive Exposure: Not on file  Financial Resource Strain: Low Risk (01/10/2023)   Overall Financial Resource Strain (CARDIA)    Difficulty of Paying Living Expenses: Not very hard  Food Insecurity: No Food Insecurity (01/10/2023)   Hunger Vital Sign    Worried About Running Out of Food in the Last Year: Never true    Ran Out of Food  in the Last Year: Never true  Transportation Needs: No Transportation Needs (01/10/2023)   PRAPARE - Administrator, Civil Service (Medical): No    Lack of Transportation (Non-Medical): No  Physical Activity: Insufficiently Active (01/10/2023)   Exercise Vital Sign    Days of Exercise per Week: 1 day    Minutes of Exercise per Session: 20 min  Stress: Stress Concern Present (01/10/2023)   [employer] of Occupational Health - Occupational Stress Questionnaire    Feeling of Stress : To some extent  Social Connections: Unknown (01/10/2023)   Social Connection and Isolation Panel    Frequency of Communication with Friends and Family: More  than three times a week    Frequency of Social Gatherings with Friends and Family: Twice a week    Attends Religious Services: Patient declined    Database Administrator or Organizations: No    Attends Engineer, Structural: Not on file    Marital Status: Married  Catering Manager Violence: Not on file  Depression (PHQ2-9): Low Risk (01/21/2024)   Depression (PHQ2-9)    PHQ-2 Score: 4  Alcohol Screen: Not on file  Housing: Low Risk (01/10/2023)   Housing    Last Housing Risk Score: 0  Utilities: Not on file  Health Literacy: Not on file    Past Surgical History:  Procedure Laterality Date   CESAREAN SECTION  2007 and 2011   CHOLECYSTECTOMY  2001   Dr. Lorrene   COLON RESECTION N/A 01/05/2016   Procedure: HAND ASSISTED LAPAROSCOPIC COLON RESECTION;  Surgeon: Laneta JULIANNA Luna, MD;  Location: [facility] ORS;  Service: General;  Laterality: N/A;   COLONOSCOPY WITH PROPOFOL  N/A 12/16/2015   Procedure: COLONOSCOPY WITH PROPOFOL ;  Surgeon: Rogelia Copping, MD;  Location: [facility];  Service: Endoscopy;  Laterality: N/A;  DIABETIC/ ORAL MED/ IUD   TRANS GLUTEAL DRAIN PLACEMENT     SEDATION    Family History  Problem Relation Age of Onset   Hyperlipidemia Mother    Sleep apnea Mother    Hypertension Mother    Hypothyroidism Sister    Diabetes Maternal Grandmother    Other Maternal Grandmother        cardiovascular disease   Hypertension Maternal Grandmother    Hypertension Paternal Grandmother    Stroke Paternal Grandmother    Colon cancer Paternal Grandfather    Breast cancer Cousin 35    Allergies[1]  Medications Ordered Prior to Encounter[2]  BP 118/64   Pulse 80   Temp 97.9 F (36.6 C) (Oral)   Ht 5' 2 (1.575 m)   Wt 187 lb (84.8 kg)   LMP  (LMP Unknown)   SpO2 98%   BMI 34.20 kg/m  Objective:   Physical Exam HENT:     Right Ear: Tympanic membrane and ear canal normal.     Left Ear: Tympanic membrane and ear canal normal.  Eyes:     Pupils: Pupils are equal,  round, and reactive to light.  Cardiovascular:     Rate and Rhythm: Normal rate and regular rhythm.  Pulmonary:     Effort: Pulmonary effort is normal.     Breath sounds: Normal breath sounds.  Abdominal:     General: Bowel sounds are normal.     Palpations: Abdomen is soft.     Tenderness: There is no abdominal tenderness.  Musculoskeletal:        General: Normal range of motion.     Cervical back: Neck supple.  Skin:  General: Skin is warm and dry.  Neurological:     Mental Status: She is alert and oriented to person, place, and time.     Cranial Nerves: No cranial nerve deficit.     Deep Tendon Reflexes:     Reflex Scores:      Patellar reflexes are 2+ on the right side and 2+ on the left side. Psychiatric:        Mood and Affect: Mood normal.     Physical Exam        Assessment & Plan:  Essential hypertension Assessment & Plan: Controlled.  Continue olmesartan  20 mg daily and hydrochlorothiazide  12.5 mg daily. CMP pending.  Orders: -     Lipid panel -     Comprehensive metabolic panel with GFR  Type 2 diabetes mellitus with hyperglycemia, without long-term current use of insulin  [facility]) Assessment & Plan: Rybelsus  has become cost prohibitive.   Start tirzepitide (Mounjaro ) for diabetes/weight loss. Start by injecting 2.5 mg into the skin once weekly for 4 weeks, then increase to 5 mg once weekly thereafter.   Repeat A1C pending.  Urine microalbumin pending  Follow-up in 3 to 6 months based on A1c result.  Orders: -     Mounjaro ; Inject 2.5 mg into the skin once a week. for diabetes.  Dispense: 2 mL; Refill: 0 -     Lipid panel -     Microalbumin / creatinine urine ratio -     Hemoglobin A1c  Preventative health care Assessment & Plan: Immunizations UTD. Pap smear due, follows with GYN Mammogram due in April, orders placed. Colonoscopy UTD, due 2027  Discussed the importance of a healthy diet and regular exercise in order for weight loss, and to  reduce the risk of further co-morbidity.  Exam stable. Labs pending.  Follow up in 1 year for repeat physical.    Anxiety and depression Assessment & Plan: Stable.  Continue sertraline  50 mg daily   Screening mammogram for breast cancer -     3D Screening Mammogram, Left and Right; Future    Assessment and Plan Assessment & Plan         Comer MARLA Gaskins, NP       [1] No Known Allergies [2]  Current Outpatient Medications on File Prior to Visit  Medication Sig Dispense Refill   hydrochlorothiazide  (HYDRODIURIL ) 12.5 MG tablet Take 1 tablet by mouth once daily for blood pressure 90 tablet 0   levonorgestrel  (MIRENA ) 20 MCG/24HR IUD 1 Intra Uterine Device (1 each total) by Intrauterine route once for 1 dose. 1 each 0   olmesartan  (BENICAR ) 20 MG tablet Take 1 tablet by mouth once daily for blood pressure 90 tablet 0   olopatadine  (PATANOL) 0.1 % ophthalmic solution INSTILL 1 DROP INTO EACH EYE TWICE DAILY (Patient taking differently: As needed, seasonally) 5 mL 0   sertraline  (ZOLOFT ) 50 MG tablet TAKE 1 TABLET BY MOUTH ONCE DAILY FOR ANXIETY AND FOR DEPRESSION 90 tablet 0   No current facility-administered medications on file prior to visit.   "

## 2024-10-15 NOTE — Assessment & Plan Note (Signed)
 Controlled.  Continue olmesartan  20 mg daily and hydrochlorothiazide  12.5 mg daily. CMP pending.

## 2024-10-15 NOTE — Assessment & Plan Note (Signed)
 Immunizations UTD. Pap smear due, follows with GYN Mammogram due in April, orders placed. Colonoscopy UTD, due 2027  Discussed the importance of a healthy diet and regular exercise in order for weight loss, and to reduce the risk of further co-morbidity.  Exam stable. Labs pending.  Follow up in 1 year for repeat physical.

## 2024-12-16 ENCOUNTER — Ambulatory Visit: Admitting: Sleep Medicine

## 2025-04-14 ENCOUNTER — Ambulatory Visit: Admitting: Primary Care
